# Patient Record
Sex: Female | Born: 1955 | Race: White | Hispanic: No | Marital: Married | Smoking: Current every day smoker
Health system: Southern US, Community
[De-identification: ages and names within clinical notes are randomized; demographics above are authoritative.]

## PROBLEM LIST (undated history)

## (undated) DIAGNOSIS — T7840XA Allergy, unspecified, initial encounter: Secondary | ICD-10-CM

## (undated) DIAGNOSIS — K219 Gastro-esophageal reflux disease without esophagitis: Secondary | ICD-10-CM

## (undated) DIAGNOSIS — I10 Essential (primary) hypertension: Secondary | ICD-10-CM

## (undated) DIAGNOSIS — F419 Anxiety disorder, unspecified: Secondary | ICD-10-CM

## (undated) HISTORY — PX: ABDOMINAL HYSTERECTOMY: SHX81

## (undated) HISTORY — DX: Gastro-esophageal reflux disease without esophagitis: K21.9

## (undated) HISTORY — DX: Allergy, unspecified, initial encounter: T78.40XA

## (undated) HISTORY — DX: Anxiety disorder, unspecified: F41.9

## (undated) HISTORY — PX: TONSILLECTOMY: SUR1361

---

## 2011-12-26 ENCOUNTER — Emergency Department (HOSPITAL_COMMUNITY)
Admission: EM | Admit: 2011-12-26 | Discharge: 2011-12-26 | Disposition: A | Discharge status: Home / Self Care | Payer: Self-pay | Attending: Emergency Medicine | Admitting: Emergency Medicine

## 2011-12-26 ENCOUNTER — Emergency Department (HOSPITAL_COMMUNITY): Payer: Self-pay

## 2011-12-26 ENCOUNTER — Encounter (HOSPITAL_COMMUNITY): Payer: Self-pay | Admitting: Physical Medicine and Rehabilitation

## 2011-12-26 DIAGNOSIS — I10 Essential (primary) hypertension: Secondary | ICD-10-CM | POA: Insufficient documentation

## 2011-12-26 DIAGNOSIS — F17200 Nicotine dependence, unspecified, uncomplicated: Secondary | ICD-10-CM | POA: Insufficient documentation

## 2011-12-26 DIAGNOSIS — J189 Pneumonia, unspecified organism: Secondary | ICD-10-CM | POA: Insufficient documentation

## 2011-12-26 DIAGNOSIS — Z720 Tobacco use: Secondary | ICD-10-CM

## 2011-12-26 HISTORY — DX: Essential (primary) hypertension: I10

## 2011-12-26 LAB — CBC
HCT: 42.1 % (ref 36.0–46.0)
Hemoglobin: 14.3 g/dL (ref 12.0–15.0)
WBC: 4.3 10*3/uL (ref 4.0–10.5)

## 2011-12-26 MED ORDER — AMLODIPINE BESYLATE 10 MG PO TABS: 10.0000 mg | ORAL_TABLET | Freq: Every day | ORAL | Status: DC

## 2011-12-26 MED ORDER — AZITHROMYCIN 250 MG PO TABS: 250.0000 mg | ORAL_TABLET | Freq: Every day | ORAL | Status: AC

## 2011-12-26 MED ORDER — ALBUTEROL SULFATE HFA 108 (90 BASE) MCG/ACT IN AERS
2.0000 | INHALATION_SPRAY | RESPIRATORY_TRACT | Status: DC | PRN
  Administered 2011-12-26: 2 via RESPIRATORY_TRACT
  Filled 2011-12-26: qty 6.7

## 2011-12-26 MED ORDER — AZITHROMYCIN 250 MG PO TABS
500.0000 mg | ORAL_TABLET | Freq: Once | ORAL | Status: AC
  Administered 2011-12-26: 500 mg via ORAL
  Filled 2011-12-26: qty 2

## 2011-12-26 NOTE — ED Notes (Signed)
Patient C/O malaise and cough since Tuesday.  C/O having a fever yesterday. C/O nausea, Vomiting and Diarrhea yesterday.

## 2011-12-26 NOTE — ED Provider Notes (Signed)
History   This chart was scribed for Tracey Justen B. Bernette Mayers, MD by [name]. The patient was seen in room TR08C/TR08C. Patient's care was started at 1223.  CSN: [id number]  Arrival date & time 12/26/11  1223   First MD Initiated Contact with Patient 12/26/11 1421      Chief Complaint  Patient presents with  . Shortness of Breath  . Cough  . Fever   Patient is a 56 y.o. female presenting with shortness of breath, cough, and fever. The history is provided by the patient. No language interpreter was used.  Shortness of Breath  The current episode started 3 to 5 days ago. The problem occurs occasionally. The problem has been gradually worsening. Nothing relieves the symptoms. The symptoms are aggravated by smoke exposure. Associated symptoms include a fever, cough and shortness of breath. Pertinent negatives include no chest pain and no rhinorrhea.  Cough Associated symptoms include shortness of breath. Pertinent negatives include no chest pain, no headaches and no rhinorrhea.  Fever Primary symptoms of the febrile illness include fever, cough and shortness of breath. Primary symptoms do not include headaches, abdominal pain, nausea, vomiting, diarrhea, dysuria or rash.    Tracey Nixon is a 56 y.o. female who presents to the Emergency Department complaining of sudden onset moderate severe constant cough with associated SOB and fever onset 3 days ago. Pt reports yellow colored phlegm, but denies pain. She has treated symptoms with Musonex with no relief. Pt is a current everyday smoker who denies the use of alcohol. Lists a h/o hypertension.  Past Medical History  Diagnosis Date  . Hypertension     No past surgical history on file.  No family history on file.  History  Substance Use Topics  . Smoking status: Current Everyday Smoker    Types: Cigarettes  . Smokeless tobacco: Not on file  . Alcohol Use: No   Review of Systems  Constitutional: Positive for fever.  HENT: Negative for  rhinorrhea.   Eyes: Negative for pain.  Respiratory: Positive for cough and shortness of breath.   Cardiovascular: Negative for chest pain.  Gastrointestinal: Negative for nausea, vomiting, abdominal pain and diarrhea.  Genitourinary: Negative for dysuria.  Musculoskeletal: Negative for back pain.  Skin: Negative for rash.  Neurological: Negative for weakness and headaches.  All other systems reviewed and are negative.    Allergies  Darvocet and Demerol  Home Medications   Current Outpatient Rx  Name Route Sig Dispense Refill  . ACETAMINOPHEN 500 MG PO TABS Oral Take 500 mg by mouth every 6 (six) hours as needed. For pain/fever.    . GUAIFENESIN ER 600 MG PO TB12 Oral Take 1,200 mg by mouth 2 (two) times daily as needed. For cold symptoms.      BP 168/90  Pulse 92  Temp 97.5 F (36.4 C) (Oral)  Resp 22  SpO2 99%  Physical Exam  Nursing note and vitals reviewed. Constitutional: She is oriented to person, place, and time. She appears well-developed and well-nourished. No distress.  HENT:  Head: Normocephalic and atraumatic.  Eyes: EOM are normal. Pupils are equal, round, and reactive to light.  Neck: Neck supple. No tracheal deviation present.  Cardiovascular: Normal rate.   Pulmonary/Chest: Effort normal. No respiratory distress. She has wheezes.       Crackles throughout.  Abdominal: Soft. She exhibits no distension.  Musculoskeletal: Normal range of motion. She exhibits no edema.  Neurological: She is alert and oriented to person, place, and time. No sensory  deficit.  Skin: Skin is warm and dry.  Psychiatric: She has a normal mood and affect. Her behavior is normal.    ED Course  Procedures (including critical care time) DIAGNOSTIC STUDIES: Oxygen Saturation is 99% on room air, normal by my interpretation.    COORDINATION OF CARE: 1434- Evaluated Pt.     Labs Reviewed  CBC   Dg Chest 2 View  12/26/2011  *RADIOLOGY REPORT*  Clinical Data: Short of breath.   Cough.  Fever.  CHEST - 2 VIEW  Comparison: None.  Findings: Heart size is normal.  Mediastinal shadows are normal. There is central bronchial thickening.  There may be minimal patchy density at the right lung base.  No consolidation.  No effusion. Ordinary degenerative changes effect the spine.  IMPRESSION: Suspicion of bronchitis.  Patchy density at the right base that could be scarring, atelectasis or mild right base pneumonia.  Original Report Authenticated By: Thomasenia Sales, M.D.     No diagnosis found.    MDM  Pt is a heavy smoker with bronchitis and ?early pneumonia. Given Abx, inhaler and refill on BP meds.       I personally performed the services described in the documentation, which were scribed in my presence. The recorded information has been reviewed and considered.     [name] B. Bernette Mayers, MD 12/26/11 [phone]

## 2011-12-26 NOTE — ED Notes (Signed)
Pt presents to department for evaluation of cough, shortness of breath, fever and generalized weakness. Ongoing x3 days. Pt states yellow colored sputum. Respirations unlabored. Denies pain. She is conscious alert and oriented x4. Skin warm and dry.

## 2011-12-26 NOTE — ED Notes (Signed)
Pt lying on lobby seats, resting with eyes closed.

## 2011-12-26 NOTE — ED Notes (Signed)
Pt ambulatory to and from radiology with tech and tolerated well. 

## 2012-01-24 ENCOUNTER — Encounter (HOSPITAL_COMMUNITY): Payer: Self-pay | Admitting: Emergency Medicine

## 2012-01-24 ENCOUNTER — Emergency Department (HOSPITAL_COMMUNITY)
Admission: EM | Admit: 2012-01-24 | Discharge: 2012-01-24 | Disposition: A | Discharge status: Home / Self Care | Payer: Self-pay | Attending: Emergency Medicine | Admitting: Emergency Medicine

## 2012-01-24 DIAGNOSIS — S81859A Open bite, unspecified lower leg, initial encounter: Secondary | ICD-10-CM

## 2012-01-24 DIAGNOSIS — S91009A Unspecified open wound, unspecified ankle, initial encounter: Secondary | ICD-10-CM | POA: Insufficient documentation

## 2012-01-24 DIAGNOSIS — W540XXA Bitten by dog, initial encounter: Secondary | ICD-10-CM | POA: Insufficient documentation

## 2012-01-24 DIAGNOSIS — S81009A Unspecified open wound, unspecified knee, initial encounter: Secondary | ICD-10-CM | POA: Insufficient documentation

## 2012-01-24 MED ORDER — PENICILLIN V POTASSIUM 500 MG PO TABS
ORAL_TABLET | ORAL | Status: AC
  Administered 2012-01-24: 500 mg via ORAL
  Filled 2012-01-24: qty 1

## 2012-01-24 MED ORDER — TRIAMCINOLONE ACETONIDE 40 MG/ML IJ SUSP
40.0000 mg | Freq: Once | INTRAMUSCULAR | Status: DC
  Filled 2012-01-24: qty 1

## 2012-01-24 MED ORDER — PENICILLIN V POTASSIUM 500 MG PO TABS: 500.0000 mg | ORAL_TABLET | Freq: Three times a day (TID) | ORAL | Status: AC

## 2012-01-24 MED ORDER — PENICILLIN V POTASSIUM 500 MG PO TABS
500.0000 mg | ORAL_TABLET | Freq: Once | ORAL | Status: AC
  Administered 2012-01-24: 500 mg via ORAL

## 2012-01-24 NOTE — ED Provider Notes (Signed)
History     CSN: [id number]  Arrival date & time 01/24/12  1903   First MD Initiated Contact with Patient 01/24/12 2008      No chief complaint on file.   (Consider location/radiation/quality/duration/timing/severity/associated sxs/prior treatment) HPI  -year-old female in no acute distress complaining of dog bite to posterior left knee occurring several hours ago. Patient was bit by a friend's pit bull when she entered the backyard. Patient's tetanus was given within the last 5 years. Bleeding control and patient states she has full range of motion to the knee without numbness or paresthesia.  Past Medical History  Diagnosis Date  . Hypertension     Past Surgical History  Procedure Date  . Abdominal hysterectomy     History reviewed. No pertinent family history.  History  Substance Use Topics  . Smoking status: Current Everyday Smoker -- 0.5 packs/day    Types: Cigarettes  . Smokeless tobacco: Not on file  . Alcohol Use: No    OB History    Grav Para Term Preterm Abortions TAB SAB Ect Mult Living                  Review of Systems  Skin: Positive for wound.  All other systems reviewed and are negative.    Allergies  Darvocet and Demerol  Home Medications   Current Outpatient Rx  Name Route Sig Dispense Refill  . AMLODIPINE BESYLATE 10 MG PO TABS Oral Take 10 mg by mouth daily.    Marland Kitchen PENICILLIN V POTASSIUM 500 MG PO TABS Oral Take 1 tablet (500 mg total) by mouth 3 (three) times daily. 30 tablet 0    BP 184/86  Pulse 96  Temp 100.3 F (37.9 C) (Oral)  Resp 22  Ht 5\' 2"  (1.575 m)  Wt 140 lb (63.504 kg)  BMI 25.61 kg/m2  SpO2 99%  Physical Exam  Vitals reviewed. Constitutional: She is oriented to person, place, and time. She appears well-developed and well-nourished. No distress.  HENT:  Head: Normocephalic.  Eyes: Conjunctivae and EOM are normal.  Cardiovascular: Normal rate.   Pulmonary/Chest: Effort normal.  Musculoskeletal: Normal range of  motion.  Neurological: She is alert and oriented to person, place, and time.  Skin:       Contusion in the shape of teeth marks to the posterior of the left knee with swelling. Patient also has 2x 1 cm full-thickness lacerations no penetration into the muscle. Wound is not contaminated.  Psychiatric: She has a normal mood and affect.    ED Course  Procedures (including critical care time)  LACERATION REPAIR Performed by: Wynetta Emery Authorized by: Wynetta Emery Consent: Verbal consent obtained. Risks and benefits: risks, benefits and alternatives were discussed Consent given by: patient Patient identity confirmed: provided demographic data Prepped and Draped in normal sterile fashion Wound explored  Laceration Location: Posterior left knee  Laceration Length: 2x  1 cm  No Foreign Bodies seen or palpated  Anesthesia: local infiltration  Local anesthetic: lidocaine 2% without epinephrine  Anesthetic total: 5 ml  Irrigation method: syringe Amount of cleaning: Wound irrigated with 1 L of normal saline under low pressure   Skin closure: Wound was approximated loosely and secured with half-inch Steri-Strips    Patient tolerance: Patient tolerated the procedure well with no immediate complications.  Labs Reviewed - No data to display No results found.   1. Dog Bite Of Lower Leg       MDM  Patient presenting with dog bite to  posterior left knee. Dog bite report form for [place] for [place] filled out by [employer]. Wound irrigated profusely with 1 L of normal saline patient has been to ask 1 cm full-thickness lacerations. Is a bite wound is not appropriate to close. I approximated the wound loosely and closed with Steri-Strips and povidone iodine. Patient is very concerned about the money for antibiotics therefore instead of Augmentin I will give her penicillin VK. First dose will be given in the ED. Patient felt extensively on wound care and signs of infection and advised to  return immediately for any sign of infection. Pt verbalized understanding and agrees with care plan. Outpatient follow-up and return precautions given.          Wynetta Emery, PA-C 01/24/12 2125

## 2012-01-24 NOTE — ED Provider Notes (Signed)
Medical screening examination/treatment/procedure(s) were performed by non-physician practitioner and as supervising physician I was immediately available for consultation/collaboration.   Gaetan Spieker M [place], MD 01/24/12 2248 

## 2012-01-24 NOTE — ED Notes (Signed)
Bit by pitbull on back of left knee. 2 puncture marks and abrasions to back of knee, bleeding controlled

## 2012-01-24 NOTE — ED Notes (Signed)
Animal bite faxed [phone]

## 2012-03-27 ENCOUNTER — Emergency Department (HOSPITAL_COMMUNITY)
Admission: EM | Admit: 2012-03-27 | Discharge: 2012-03-28 | Discharge status: Against Medical Advice | Payer: Self-pay | Attending: Emergency Medicine | Admitting: Emergency Medicine

## 2012-03-27 ENCOUNTER — Encounter (HOSPITAL_COMMUNITY): Payer: Self-pay | Admitting: Family Medicine

## 2012-03-27 ENCOUNTER — Emergency Department (HOSPITAL_COMMUNITY): Payer: Self-pay

## 2012-03-27 DIAGNOSIS — R059 Cough, unspecified: Secondary | ICD-10-CM | POA: Insufficient documentation

## 2012-03-27 DIAGNOSIS — R509 Fever, unspecified: Secondary | ICD-10-CM | POA: Insufficient documentation

## 2012-03-27 DIAGNOSIS — R05 Cough: Secondary | ICD-10-CM | POA: Insufficient documentation

## 2012-03-27 DIAGNOSIS — J3489 Other specified disorders of nose and nasal sinuses: Secondary | ICD-10-CM | POA: Insufficient documentation

## 2012-03-27 NOTE — ED Notes (Signed)
Patient states "I think I got the flu." States she has had fever, chills, cough and congestion since last night. States she has been in bed all day with body aches.

## 2012-03-27 NOTE — ED Notes (Signed)
Pt not WR when called.

## 2012-03-27 NOTE — ED Notes (Signed)
Pt not in WR when called

## 2012-03-28 NOTE — ED Notes (Signed)
Pt not in WR when called

## 2012-09-21 ENCOUNTER — Emergency Department (HOSPITAL_COMMUNITY)
Admission: EM | Admit: 2012-09-21 | Discharge: 2012-09-22 | Disposition: A | Discharge status: Home / Self Care | Payer: No Typology Code available for payment source | Attending: Emergency Medicine | Admitting: Emergency Medicine

## 2012-09-21 ENCOUNTER — Encounter (HOSPITAL_COMMUNITY): Payer: Self-pay

## 2012-09-21 ENCOUNTER — Emergency Department (HOSPITAL_COMMUNITY): Payer: No Typology Code available for payment source

## 2012-09-21 DIAGNOSIS — Y9389 Activity, other specified: Secondary | ICD-10-CM | POA: Insufficient documentation

## 2012-09-21 DIAGNOSIS — S46909A Unspecified injury of unspecified muscle, fascia and tendon at shoulder and upper arm level, unspecified arm, initial encounter: Secondary | ICD-10-CM | POA: Insufficient documentation

## 2012-09-21 DIAGNOSIS — M25511 Pain in right shoulder: Secondary | ICD-10-CM

## 2012-09-21 DIAGNOSIS — S4980XA Other specified injuries of shoulder and upper arm, unspecified arm, initial encounter: Secondary | ICD-10-CM | POA: Insufficient documentation

## 2012-09-21 DIAGNOSIS — Y92410 Unspecified street and highway as the place of occurrence of the external cause: Secondary | ICD-10-CM | POA: Insufficient documentation
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
## 2012-09-22 MED ORDER — AMLODIPINE BESYLATE 10 MG PO TABS: 10.0000 mg | ORAL_TABLET | Freq: Every day | ORAL | Status: DC

## 2012-09-22 MED ORDER — METHOCARBAMOL 500 MG PO TABS: 500.0000 mg | ORAL_TABLET | Freq: Two times a day (BID) | ORAL | Status: DC

## 2012-09-22 MED ORDER — HYDROCODONE-ACETAMINOPHEN 5-325 MG PO TABS: 1.0000 | ORAL_TABLET | ORAL | Status: DC | PRN

## 2012-09-23 NOTE — ED Provider Notes (Signed)
Medical screening examination/treatment/procedure(s) were performed by non-physician practitioner and as supervising physician I was immediately available for consultation/collaboration.  Mikalia Fessel T Mckinsey Keagle, MD 09/23/12 1144 

## 2016-01-18 ENCOUNTER — Encounter (HOSPITAL_COMMUNITY): Payer: Self-pay | Admitting: Emergency Medicine

## 2016-01-18 ENCOUNTER — Observation Stay (HOSPITAL_COMMUNITY): Payer: Self-pay

## 2016-01-18 ENCOUNTER — Observation Stay (HOSPITAL_COMMUNITY)
Admission: EM | Admit: 2016-01-18 | Discharge: 2016-01-19 | Disposition: A | Discharge status: Home / Self Care | Payer: Self-pay | Attending: Internal Medicine | Admitting: Internal Medicine

## 2016-01-18 ENCOUNTER — Emergency Department (HOSPITAL_COMMUNITY): Payer: Self-pay

## 2016-01-18 DIAGNOSIS — M79602 Pain in left arm: Secondary | ICD-10-CM | POA: Diagnosis present

## 2016-01-18 DIAGNOSIS — Z720 Tobacco use: Secondary | ICD-10-CM | POA: Diagnosis present

## 2016-01-18 DIAGNOSIS — F17210 Nicotine dependence, cigarettes, uncomplicated: Secondary | ICD-10-CM | POA: Insufficient documentation

## 2016-01-18 DIAGNOSIS — Z79899 Other long term (current) drug therapy: Secondary | ICD-10-CM | POA: Insufficient documentation

## 2016-01-18 DIAGNOSIS — N289 Disorder of kidney and ureter, unspecified: Secondary | ICD-10-CM

## 2016-01-18 DIAGNOSIS — R4182 Altered mental status, unspecified: Principal | ICD-10-CM

## 2016-01-18 DIAGNOSIS — I10 Essential (primary) hypertension: Secondary | ICD-10-CM | POA: Diagnosis present

## 2016-01-18 DIAGNOSIS — Z9119 Patient's noncompliance with other medical treatment and regimen: Secondary | ICD-10-CM | POA: Insufficient documentation

## 2016-01-18 DIAGNOSIS — F1411 Cocaine abuse, in remission: Secondary | ICD-10-CM | POA: Diagnosis present

## 2016-01-18 DIAGNOSIS — F1410 Cocaine abuse, uncomplicated: Secondary | ICD-10-CM

## 2016-01-18 DIAGNOSIS — I129 Hypertensive chronic kidney disease with stage 1 through stage 4 chronic kidney disease, or unspecified chronic kidney disease: Secondary | ICD-10-CM | POA: Insufficient documentation

## 2016-01-18 DIAGNOSIS — N183 Chronic kidney disease, stage 3 unspecified: Secondary | ICD-10-CM | POA: Diagnosis present

## 2016-01-18 DIAGNOSIS — R410 Disorientation, unspecified: Secondary | ICD-10-CM

## 2016-01-18 DIAGNOSIS — G459 Transient cerebral ischemic attack, unspecified: Secondary | ICD-10-CM

## 2016-01-18 LAB — RAPID URINE DRUG SCREEN, HOSP PERFORMED
Amphetamines: NOT DETECTED
BENZODIAZEPINES: NOT DETECTED
Barbiturates: NOT DETECTED
COCAINE: POSITIVE — AB
OPIATES: NOT DETECTED
Tetrahydrocannabinol: NOT DETECTED

## 2016-01-18 LAB — I-STAT CHEM 8, ED
BUN: 23 mg/dL — ABNORMAL HIGH (ref 6–20)
Calcium, Ion: 1.1 mmol/L — ABNORMAL LOW (ref 1.12–1.23)
Chloride: 104 mmol/L (ref 101–111)
Creatinine, Ser: 1.4 mg/dL — ABNORMAL HIGH (ref 0.44–1.00)
Glucose, Bld: 128 mg/dL — ABNORMAL HIGH (ref 65–99)
HEMATOCRIT: 42 % (ref 36.0–46.0)
HEMOGLOBIN: 14.3 g/dL (ref 12.0–15.0)
POTASSIUM: 3.6 mmol/L (ref 3.5–5.1)
SODIUM: 139 mmol/L (ref 135–145)
TCO2: 23 mmol/L (ref 0–100)

## 2016-01-18 LAB — COMPREHENSIVE METABOLIC PANEL
ALT: 14 U/L (ref 14–54)
ANION GAP: 12 (ref 5–15)
AST: 21 U/L (ref 15–41)
Albumin: 3.9 g/dL (ref 3.5–5.0)
Alkaline Phosphatase: 87 U/L (ref 38–126)
BUN: 20 mg/dL (ref 6–20)
CHLORIDE: 103 mmol/L (ref 101–111)
CO2: 21 mmol/L — ABNORMAL LOW (ref 22–32)
Calcium: 8.9 mg/dL (ref 8.9–10.3)
Creatinine, Ser: 1.41 mg/dL — ABNORMAL HIGH (ref 0.44–1.00)
GFR, EST AFRICAN AMERICAN: 45 mL/min — AB (ref >60)
GFR, EST NON AFRICAN AMERICAN: 39 mL/min — AB (ref >60)
Glucose, Bld: 130 mg/dL — ABNORMAL HIGH (ref 65–99)
POTASSIUM: 3.7 mmol/L (ref 3.5–5.1)
Sodium: 136 mmol/L (ref 135–145)
Total Bilirubin: 0.9 mg/dL (ref 0.3–1.2)
Total Protein: 7.1 g/dL (ref 6.5–8.1)

## 2016-01-18 LAB — DIFFERENTIAL
BASOS ABS: 0 10*3/uL (ref 0.0–0.1)
BASOS PCT: 0 %
EOS ABS: 0.2 10*3/uL (ref 0.0–0.7)
EOS PCT: 2 %
Lymphocytes Relative: 21 %
Lymphs Abs: 2.4 10*3/uL (ref 0.7–4.0)
MONOS PCT: 6 %
Monocytes Absolute: 0.6 10*3/uL (ref 0.1–1.0)
NEUTROS PCT: 71 %
Neutro Abs: 7.8 10*3/uL — ABNORMAL HIGH (ref 1.7–7.7)

## 2016-01-18 LAB — CBC
HEMATOCRIT: 39.5 % (ref 36.0–46.0)
Hemoglobin: 13.1 g/dL (ref 12.0–15.0)
MCH: 29 pg (ref 26.0–34.0)
MCHC: 33.2 g/dL (ref 30.0–36.0)
MCV: 87.4 fL (ref 78.0–100.0)
Platelets: 371 10*3/uL (ref 150–400)
RBC: 4.52 MIL/uL (ref 3.87–5.11)
RDW: 16.3 % — AB (ref 11.5–15.5)
WBC: 11 10*3/uL — ABNORMAL HIGH (ref 4.0–10.5)

## 2016-01-18 LAB — URINE MICROSCOPIC-ADD ON

## 2016-01-18 LAB — URINALYSIS, ROUTINE W REFLEX MICROSCOPIC
Bilirubin Urine: NEGATIVE
GLUCOSE, UA: NEGATIVE mg/dL
Ketones, ur: NEGATIVE mg/dL
LEUKOCYTES UA: NEGATIVE
Nitrite: NEGATIVE
PH: 5.5 (ref 5.0–8.0)
Protein, ur: NEGATIVE mg/dL
SPECIFIC GRAVITY, URINE: 1.012 (ref 1.005–1.030)

## 2016-01-18 LAB — PROTIME-INR
INR: 1.17
PROTHROMBIN TIME: 15 s (ref 11.4–15.2)

## 2016-01-18 LAB — I-STAT TROPONIN, ED: TROPONIN I, POC: 0 ng/mL (ref 0.00–0.08)

## 2016-01-18 LAB — APTT: APTT: 42 s — AB (ref 24–36)

## 2016-01-18 LAB — CBG MONITORING, ED: Glucose-Capillary: 124 mg/dL — ABNORMAL HIGH (ref 65–99)

## 2016-01-18 LAB — ETHANOL

## 2016-01-18 MED ORDER — LORAZEPAM 2 MG/ML IJ SOLN: 1.0000 mg | Freq: Two times a day (BID) | INTRAMUSCULAR | Status: DC | PRN

## 2016-01-18 MED ORDER — ACETAMINOPHEN 325 MG PO TABS: 650.0000 mg | ORAL_TABLET | Freq: Four times a day (QID) | ORAL | Status: DC | PRN

## 2016-01-18 MED ORDER — NICOTINE 21 MG/24HR TD PT24
21.0000 mg | MEDICATED_PATCH | Freq: Every day | TRANSDERMAL | Status: DC
  Administered 2016-01-18 – 2016-01-19 (×2): 21 mg via TRANSDERMAL
  Filled 2016-01-18 (×2): qty 1

## 2016-01-18 MED ORDER — ONDANSETRON HCL 4 MG PO TABS: 4.0000 mg | ORAL_TABLET | Freq: Four times a day (QID) | ORAL | Status: DC | PRN

## 2016-01-18 MED ORDER — METHOCARBAMOL 500 MG PO TABS: 500.0000 mg | ORAL_TABLET | Freq: Two times a day (BID) | ORAL | Status: DC | PRN

## 2016-01-18 MED ORDER — STROKE: EARLY STAGES OF RECOVERY BOOK
Freq: Once | Status: DC
  Filled 2016-01-18: qty 1

## 2016-01-18 MED ORDER — AMLODIPINE BESYLATE 10 MG PO TABS
10.0000 mg | ORAL_TABLET | Freq: Every day | ORAL | Status: DC
  Administered 2016-01-18 – 2016-01-19 (×2): 10 mg via ORAL
  Filled 2016-01-18 (×2): qty 1

## 2016-01-18 MED ORDER — ENOXAPARIN SODIUM 40 MG/0.4ML ~~LOC~~ SOLN
40.0000 mg | SUBCUTANEOUS | Status: DC
  Administered 2016-01-18 – 2016-01-19 (×2): 40 mg via SUBCUTANEOUS
  Filled 2016-01-18 (×2): qty 0.4

## 2016-01-18 MED ORDER — ONDANSETRON HCL 4 MG/2ML IJ SOLN: 4.0000 mg | Freq: Four times a day (QID) | INTRAMUSCULAR | Status: DC | PRN

## 2016-01-18 MED ORDER — SODIUM CHLORIDE 0.9% FLUSH
3.0000 mL | Freq: Two times a day (BID) | INTRAVENOUS | Status: DC
  Administered 2016-01-18: 3 mL via INTRAVENOUS

## 2016-01-18 MED ORDER — ACETAMINOPHEN 650 MG RE SUPP: 650.0000 mg | Freq: Four times a day (QID) | RECTAL | Status: DC | PRN

## 2016-01-18 NOTE — ED Provider Notes (Signed)
[facility]-EMERGENCY DEPT Provider Note   CSN: [id number] Arrival date & time: 01/18/16  4098  First Provider Contact:  First MD Initiated Contact with Patient 01/18/16 0606        History   Chief Complaint Unresponsive  HPI Baylei Duffner is a 60 y.o. female.  The history is provided by the EMS personnel and the spouse. The history is limited by the condition of the patient (Unresponsive).  She has a history of hypertension. She had gone to sleep at about 2 AM. About 5 AM, she got up and went to look after cat. She came back in since she didn't feel well. According to her husband, her right arm was shaking. She states she would have been bitten on her upper arm earlier and that it seemed to be getting worse. She then collapsed and is been unresponsive since then. EMS was called and relate that patient has been unresponsive and with rightward gaze preference. She's never had any episodes like this before.  Past Medical History:  Diagnosis Date  . Hypertension     There are no active problems to display for this patient.   Past Surgical History:  Procedure Laterality Date  . ABDOMINAL HYSTERECTOMY      OB History    No data available       Home Medications    Prior to Admission medications   Medication Sig Start Date End Date Taking? Authorizing Provider  amLODipine (NORVASC) 10 MG tablet Take 10 mg by mouth daily. 12/26/11 12/25/12  Susy Frizzle, MD  amLODipine (NORVASC) 10 MG tablet Take 1 tablet (10 mg total) by mouth daily. 09/22/12   Garlon Hatchet, PA-C  HYDROcodone-acetaminophen (NORCO/VICODIN) 5-325 MG per tablet Take 1 tablet by mouth every 4 (four) hours as needed for pain. 09/22/12   Garlon Hatchet, PA-C  ibuprofen (ADVIL,MOTRIN) 200 MG tablet Take 200 mg by mouth every 6 (six) hours as needed for pain.    Historical Provider, MD  methocarbamol (ROBAXIN) 500 MG tablet Take 1 tablet (500 mg total) by mouth 2 (two) times daily. 09/22/12   Garlon Hatchet, PA-C    Family  History No family history on file.  Social History Social History  Substance Use Topics  . Smoking status: Current Every Day Smoker    Packs/day: 0.50    Types: Cigarettes  . Smokeless tobacco: Not on file  . Alcohol use No     Allergies   Darvocet [propoxyphene n-acetaminophen] and Demerol [meperidine]   Review of Systems Review of Systems  Unable to perform ROS: Patient unresponsive     Physical Exam Updated Vital Signs BP 160/94   Pulse 71   Temp (!) 96.7 F (35.9 C) (Axillary)   Resp 25   Ht 5\' 2"  (1.575 m)   Wt 181 lb 7 oz (82.3 kg)   SpO2 96%   BMI 33.19 kg/m   Physical Exam  Nursing note and vitals reviewed.  60 year old female, resting comfortably and in no acute distress. Vital signs are significant for hypertension. Oxygen saturation is 96%, which is normal. Head is normocephalic and atraumatic. PERRLA. Eyes are looking forward but immediately go to the right when lids are forced open. Oropharynx is clear. Neck is nontender and supple without adenopathy or JVD. There are no carotid bruits. Back is nontender and there is no CVA tenderness. Lungs are clear without rales, wheezes, or rhonchi. Chest is nontender. Heart has regular rate and rhythm without murmur. Abdomen is soft, flat,  nontender without masses or hepatosplenomegaly and peristalsis is normoactive. Extremities have no cyanosis or edema, full range of motion is present. Area of mild erythema of the right upper arm consistent with local reaction to an insect bite. Skin is warm and dry without rash. Neurologic: She is unresponsive to verbal and painful stimuli. There is no obvious facial asymmetry. Strong gaze preference to the right. Arms and legs are flaccid. No Babinski reflex. No posturing..   ED Treatments / Results  Labs (all labs ordered are listed, but only abnormal results are displayed) Labs Reviewed  APTT - Abnormal; Notable for the following:       Result Value   aPTT 42 (*)     All other components within normal limits  CBC - Abnormal; Notable for the following:    WBC 11.0 (*)    RDW 16.3 (*)    All other components within normal limits  DIFFERENTIAL - Abnormal; Notable for the following:    Neutro Abs 7.8 (*)    All other components within normal limits  COMPREHENSIVE METABOLIC PANEL - Abnormal; Notable for the following:    CO2 21 (*)    Glucose, Bld 130 (*)    Creatinine, Ser 1.41 (*)    GFR calc non Af Amer 39 (*)    GFR calc Af Amer 45 (*)    All other components within normal limits  URINE RAPID DRUG SCREEN, HOSP PERFORMED - Abnormal; Notable for the following:    Cocaine POSITIVE (*)    All other components within normal limits  URINALYSIS, ROUTINE W REFLEX MICROSCOPIC (NOT AT [facility]) - Abnormal; Notable for the following:    Hgb urine dipstick TRACE (*)    All other components within normal limits  URINE MICROSCOPIC-ADD ON - Abnormal; Notable for the following:    Squamous Epithelial / LPF 0-5 (*)    Bacteria, UA RARE (*)    Casts HYALINE CASTS (*)    All other components within normal limits  I-STAT CHEM 8, ED - Abnormal; Notable for the following:    BUN 23 (*)    Creatinine, Ser 1.40 (*)    Glucose, Bld 128 (*)    Calcium, Ion 1.10 (*)    All other components within normal limits  CBG MONITORING, ED - Abnormal; Notable for the following:    Glucose-Capillary 124 (*)    All other components within normal limits  ETHANOL  PROTIME-INR  I-STAT TROPOININ, ED    EKG  EKG Interpretation  Date/Time:  Friday January 18 2016 06:26:44 EDT Ventricular Rate:  90 PR Interval:    QRS Duration: 98 QT Interval:  399 QTC Calculation: 489 R Axis:   67 Text Interpretation:  Sinus rhythm Probable left atrial enlargement Borderline prolonged QT interval No old tracing to compare Confirmed by [facility]  MD, Sheryle Vice ([id number]) on 01/18/2016 7:15:04 AM       Radiology Ct Head Wo Contrast  Result Date: 01/18/2016 CLINICAL DATA:  Initial evaluation for acute  unresponsiveness. EXAM: CT HEAD WITHOUT CONTRAST TECHNIQUE: Contiguous axial images were obtained from the base of the skull through the vertex without intravenous contrast. COMPARISON:  None. FINDINGS: Age-related cerebral volume loss present. Patchy and confluent hypodensity within the periventricular and deep white matter both cerebral hemispheres most likely related chronic small vessel ischemic disease. Probable small remote lacunar infarct within the left basal ganglia. No definite acute large vessel territory infarct. Gray-white matter differentiation maintained. Deep gray nuclei relatively maintained. Vasculature is fairly uniform in appearance  without definite hyperdense vessel. No acute intracranial hemorrhage. No mass lesion, midline shift, or mass effect. No hydrocephalus. No extra-axial fluid collection. Scalp soft tissues within normal limits. No acute abnormality about the globes and orbits. Paranasal sinuses are clear.  No mastoid effusion. Calvarium intact. IMPRESSION: 1. No acute intracranial process identified. 2. Age-related cerebral atrophy with moderate chronic microvascular ischemic disease. Critical Value/emergent results were called by telephone at the time of interpretation on 01/18/2016 at 6:44 am to Dr. Roseanne Reno, who verbally acknowledged these results. Electronically Signed   By: Rise Mu M.D.   On: 01/18/2016 06:51   Procedures Procedures (including critical care time) CRITICAL CARE Performed by: RAXEN,MMHWK Total critical care time: 45 minutes Critical care time was exclusive of separately billable procedures and treating other patients. Critical care was necessary to treat or prevent imminent or life-threatening deterioration. Critical care was time spent personally by me on the following activities: development of treatment plan with patient and/or surrogate as well as nursing, discussions with consultants, evaluation of patient's response to treatment,  examination of patient, obtaining history from patient or surrogate, ordering and performing treatments and interventions, ordering and review of laboratory studies, ordering and review of radiographic studies, pulse oximetry and re-evaluation of patient's condition.   Medications Ordered in ED Medications - No data to display   Initial Impression / Assessment and Plan / ED Course  I have reviewed the triage vital signs and the nursing notes.  Pertinent labs & imaging results that were available during my care of the patient were reviewed by me and considered in my medical decision making (see chart for details).  Clinical Course  Comment By Time  Renal insufficiency. Cocaine abuse. Dione Booze, MD 07/28 [phone]    Syncopal episode with prolonged loss of consciousness and rightward preference for gaze. Consider atypical seizure. She is being sent for emergent CT scan and routine stroke panel labs are obtained. Old records are reviewed and she has no relevant past visits.  ED workup is significant only for presence of cocaine in urine and a mild renal insufficiency. Neurology has evaluated the patient and feels that this most likely represents a conversion reaction. Patient was reevaluated and is showing some purposeful movements and will avoid letting it dropped hand hit her face. She will be admitted to the medicine service.Case is discussed with Tawanna Cooler Dr. Lawerance Bach of internal medicine teaching service who agrees to admit the patient.  Final Clinical Impressions(s) / ED Diagnoses   Final diagnoses:  Altered mental status, unspecified altered mental status type  Cocaine abuse  Renal insufficiency    New Prescriptions New Prescriptions   No medications on file     Dione Booze, MD 01/18/16 [phone]

## 2016-01-18 NOTE — Progress Notes (Signed)
Code Stroke called on 60 y.o female presenting with altered mental status of unclear etiology.Pertinent history of hypertension and noncompliance with treatment. LSN at 0530 when she went to bed per family. This morning at 0530 to check on cat. According to her husband she returned saying she didn't feel well and her right arm was shaking. She complained of right arm weakness and tingling. EMS was called and she subsequently became unresponsive. Pt brought to [facility].CT scan of her head showed no acute intracranial abnormality, per Neurologist Dr. Roseanne Reno. NIHSS completed. Patient had resistance to eye opening as well as responses to visual threat bilaterally. Muscle tone was equal and normal throughout. NIHSS scored 12 for ALOC, diminished response to sensory in lower extremities, and mute. Code stroke canceled per Dr. Roseanne Reno at [phone]. Right arm swollen and red, ED RN to follow up with EDP.

## 2016-01-18 NOTE — H&P (Signed)
Date: 01/18/2016               Tracey Nixon Name:  Tracey Nixon MRN: [id number]  DOB: [birth year] Age / Sex: 60 y.o., female   PCP: Provider Default, MD         Medical Service: Internal Medicine Teaching Service         Attending Physician: Dr. Doneen Poisson, MD    First Contact: Dr. Althia Forts Pager: [phone]  Second Contact: Dr. Griffin Basil Pager: [phone]       After Hours (After 5p/  First Contact Pager: [phone]  weekends / holidays): Second Contact Pager: [phone]   Chief Complaint: syncope  History of Present Illness: Tracey Nixon is a 60 yo female with PMHx of HTN, Tobacco abuse, Cocaine abuse, reported Bipolar disorder with history of suicidal attempt who presents to the ED after being unresponsive.   History is obtained from Tracey Nixon's Tracey Nixon of 28 years. Tracey Nixon states that yesterday afternoon Tracey Nixon was outside gardening in Tracey Nixon rose bushes Tracey came inside complaining of right arm pain. The area on the proximal anterior arm was red, swollen Tracey painful. Tracey Nixon denied any injury to the area, bites, scratches. He offered to take Tracey Nixon to the hospital for evaluation, but Tracey Nixon reportedly refused staying Tracey Nixon would see how it did overnight. This morning at 0530 Tracey Nixon awoke stating Tracey Nixon right arm was in severe pain. Tracey Nixon Tracey Nixon planned to take Tracey Nixon over to the local fire station for it to be looked at. Tracey Nixon came into the living room telling him to call 911 because Tracey Nixon didn't feel right. Tracey Nixon sat down on the couch Tracey subsequently "passed out." Tracey Nixon right hand was shaking prior to the episode. Tracey Nixon was unresponsive after passing out from that time at 0530 until 0830 in the ED.   When Tracey Nixon originally presented to the ED, Tracey Nixon was called a code stroke. Tracey Nixon was evaluated by the EDP [facility] who cancelled the code stroke Tracey felt Tracey Nixon symptoms were less consistent with a neurologic process Tracey more consistent with conversion disorder. Tracey Nixon was unresponsive to voice during those  examinations; however, notes indicate that Tracey Nixon would withdraw to pain, move Tracey Nixon arm to avoid it hitting Tracey Nixon in Tracey Nixon face Tracey would withdraw to the smell of ammonia. CT head negative for acute CVA. Neurology recommended MRI, EEG, Tracey UDS. UDS positive for cocaine. Around 830 as I was walking into the room, Tracey Nixon's Tracey Nixon told Tracey Nixon Tracey Nixon needed to respond or Tracey Nixon would have to stay in the hospital. Tracey Nixon became alert, writhing in bed with purposeful movements. Tracey Nixon was alert oriented, however difficult to focus Tracey difficult to provide a good history. Tracey Nixon states Tracey Nixon has no memory of this mornings events. Tracey Nixon repeatedly states Tracey Nixon right arm hurts up into Tracey Nixon right side of Tracey Nixon neck. Tracey Nixon admits to nausea but denies associated fever or chills, chest pain, shortness of breath, vomiting or diarrhea. Tracey Nixon denies any known trauma or bites to right arm. Tracey Nixon denies history of blood clots.   In the ED, Tracey Nixon was afebrile, hypertensive at 167/112 to 180/84, HR 86, RR 21, pulse ox 99% on room air. CBC showed mild leukocytosis of 11, Hgb 13. BMET showed creatinine of 1.41. UDS positive for cocaine. Troponin negative. CT head showed no acute intracranial abnormality but cerebral atrophy with chronic ischemia.   Meds: Current Facility-Administered Medications  Medication Dose Route Frequency Provider Last Rate Last Dose  .  stroke: mapping our early stages of recovery book  Does not apply Once Annaleigh Steinmeyer Lucrezia Starch, MD      . acetaminophen (TYLENOL) tablet 650 mg  650 mg Oral Q6H PRN Florine Sprenkle Lucrezia Starch, MD       Or  . acetaminophen (TYLENOL) suppository 650 mg  650 mg Rectal Q6H PRN Royce Stegman Lucrezia Starch, MD      . amLODipine (NORVASC) tablet 10 mg  10 mg Oral Daily Callan Norden R Anton Cheramie, MD      . enoxaparin (LOVENOX) injection 40 mg  40 mg Subcutaneous Q24H Lilianna Case R Tanee Henery, MD      . LORazepam (ATIVAN) injection 1 mg  1 mg Intravenous BID PRN Kathyrn Warmuth Lucrezia Starch, MD      . methocarbamol (ROBAXIN) tablet 500 mg  500 mg Oral BID PRN Karleigh Bunte Lucrezia Starch, MD       . nicotine (NICODERM CQ - dosed in mg/24 hours) patch 21 mg  21 mg Transdermal Daily Johnothan Bascomb R Nattaly Yebra, MD      . sodium chloride flush (NS) 0.9 % injection 3 mL  3 mL Intravenous Q12H Sharnee Douglass Lucrezia Starch, MD        Allergies: Allergies as of 01/18/2016 - Review Complete 01/18/2016  Allergen Reaction Noted  . Darvocet [propoxyphene n-acetaminophen] Other (See Comments) 12/26/2011  . Demerol [meperidine]  12/26/2011   Past Medical History:  Diagnosis Date  . Hypertension     Family History:  Mother: CKD, CAD, HTN  Social History:  Tobacco Use: 2 ppd x 50 years Alcohol Use: Denies Illicit Drug Use: Admit to cocaine use  Review of Systems: A complete ROS was negative except as per HPI.   Physical Exam: Vitals:   01/18/16 0815 01/18/16 0830 01/18/16 0845 01/18/16 0941  BP: 151/75 103/90 126/61 124/60  Pulse: 70 65 70 63  Resp: Temp:    97.7 F (36.5 C)  TempSrc:    Oral  SpO2: 100% 100% 98% 98%  Weight:      Height:       General: Vital signs reviewed.  Tracey Nixon is chronically ill appearing, dishelved, writhing around Tracey cooperative with exam.  Head: Normocephalic Tracey atraumatic. Eyes: EOMI, PERRLA, no scleral icterus.  Neck: Supple, trachea midline, normal ROM, no carotid bruit present.  Cardiovascular: RRR, S1 normal, S2 normal, no murmurs, gallops, or rubs. Pulmonary/Chest: Clear to auscultation bilaterally, no wheezes, rales, or rhonchi. Abdominal: Soft, non-tender, mildly distended, BS +, no guarding present.  Musculoskeletal: Right proximal anterior upper extremity erythema above axilla spread distally to bicep Tracey proximally to axilla, tender, edematous. Two 2 x 1 cm ecchymoses on anterior bicep of unclear etiology. No breaks in skin. Extremities: No lower extremity edema bilaterally,  pulses symmetric Tracey intact bilaterally.  Neurological: A&O x2, Strength is normal Tracey symmetric bilaterally, cranial nerve II-XII are grossly intact, no focal motor deficit,  sensory intact to light touch bilaterally.  Psychiatric: Abnormal mood Tracey affect. speech Tracey behavior is abnormal. Cognition Tracey memory are abnormal.   EKG: Sinus rhythm, QTC 489.  CT Head:  1. No acute intracranial process identified. 2. Age-related cerebral atrophy with moderate chronic microvascular ischemic disease.  Assessment & Plan by Problem: Principal Problem:   Altered mental status Active Problems:   HTN (hypertension)   Cocaine abuse   Tobacco abuse   CKD (chronic kidney disease) stage 3, GFR 30-59 ml/min   Left arm pain  Ms. Nixon is a 60 yo female with PMHx of HTN, Tobacco abuse, Cocaine abuse, reported Bipolar disorder with history of suicidal  attempt who presents to the ED after being unresponsive.   Altered Mental Status: Tracey Nixon presents after a questionable syncopal episode followed by unresponsiveness. Code Stroke cancelled. It seems as if Tracey Nixon was purposeful unresponsive on physical exam until Tracey Nixon sporadically came out of unresponsiveness. Differential includes conversion disorder, psychiatric disorder, cocaine-related, sub-clinical seizure, or TIA (less likely). Tracey Nixon was seen Tracey evaluated by Neurology after normal CT Head who did not feel this was an acute CVA; however, neurology did recommend EEG Tracey MRI. Tracey Nixon exam is non-focal. Tracey Nixon may have had a conversion reaction due to pain. Tracey Nixon denies any recent traumatic experiences, but does admit that Tracey Nixon has a long history of psychiatric disorders Tracey is no longer on medications.  -Neurology following appreciate recommendations -Obtain EEG r/o seizure -Obtain MRI  -Follow clinically Tracey treat RUE pain as below -Neuro checks  -Telemetry (d/c if MRI negative)  RUE Pain: Tracey Nixon presents with one day history of RUE edema, erythema Tracey tenderness. Clinical exam is concerning for DVT, thrombophlebitis, or cellulitis. Will obtain RUE duplex ultrasound. If negative, would consider treatment for cellulitis.  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  -Obtain RUE doppler ultrasound -If negative, consider treatment for cellulitis -Repeat CBC/BMET tomorrow am  HTN: Hypertensive in the 160-180s/80s. Tracey Nixon was previously on amlodipine 10 mg daily at home, but Tracey Nixon states Tracey Nixon has been off of Tracey Nixon medications for several months.  -Restart amlodipine 10 mg daily -Will need refills on discharge  CKD Stage III: Creatinine 1.4 on admission, likely chronic, but no priors for comparison.  -Repeat BMET tomorrow am  Tobacco Abuse: 100 pack year history.  -Smoking cessation counseling -Nicotine patch 21 mg daily  Cocaine Abuse: UDS positive for cocaine. Tracey Nixon admits they occasionally use cocaine, but Tracey Nixon is adamant that it has been a while for him- but not sure for Tracey Nixon. Tracey Nixon has likely use recently. Substance abuse is likely contributing to Tracey exacerbating underlying psychiatric problems.  -Counseled on abstinence  H/o of Psychiatric Disorder: Tracey Nixon states Tracey Nixon has a history of bipolar disorder Tracey other psychiatric issues he is unsure of. Tracey Nixon was previously on antidepressants, but has not been in a while. Tracey Nixon was also "court ordered" for 6 months 20 years ago due to history of suicidal attempt. Tracey Nixon was treated at [facility] for a prolonged grief reaction after Tracey Nixon mother died in 2012/11/19. Tracey Nixon was started on Valium at that time.  -Will need outpatient follow up with a PCP Tracey psychiatry  DVT/PE ppx: Lovenox SQ QD FEN: Regular CODE: Full  Dispo: Admit Tracey Nixon to Observation with expected length of stay less than 2 midnights.  Signed: Karlene Lineman, DO PGY-3 Internal Medicine Resident Pager # [phone] 01/18/2016 10:32 AM

## 2016-01-18 NOTE — ED Triage Notes (Signed)
Pt arrives via EMS as a Code stroke, LKW 0530. Pt went to bed at 0230 and had trouble falling asleep, slept for 30 minutes before getting up closer to five. Had a syncopal episode at 0530 and "never woke up" per EMS. Pt not verbally responsive at this time. IV established in the field.

## 2016-01-18 NOTE — Progress Notes (Signed)
Pt arrived to unit with no noted distress. Pt drowsy, but responsive. Follow simple commands. Complains of right arm pain. Repositioned for comfort during assessment.  Family at bedside stated that she got bit by something then she became altered mentally. Skin assessment done noted red bruising to right upper arm swollen, red, pain to touch. Safety measures in place. Call bell within reach. Will continue to monitor.

## 2016-01-18 NOTE — Consult Note (Signed)
Admission H&P    Chief Complaint: Altered mental status.  HPI: Tracey Nixon is an 60 y.o. female with a history of hypertension and noncompliance with recommended treatment brought to the emergency room and code stroke status. EMS was initially called because of swelling of her left arm with tingling. She subsequently became unresponsive. No focal deficits were noted. Patient would not follow any commands and did not respond to external stimuli. Spontaneous movements of extremities were noted bilaterally. CT scan of her head showed no acute intracranial abnormality. Patient was noted to have resistance to eye opening as well as responses to visual threat bilaterally. Muscle tone was equal and normal throughout. No gaze preference was noted.  Past Medical History:  Diagnosis Date  . Hypertension     Past Surgical History:  Procedure Laterality Date  . ABDOMINAL HYSTERECTOMY      No family history on file. Social History:  reports that she has been smoking Cigarettes.  She has been smoking about 0.50 packs per day. She has never used smokeless tobacco. She reports that she does not drink alcohol or use drugs.  Allergies:  Allergies  Allergen Reactions  . Darvocet [Propoxyphene N-Acetaminophen] Other (See Comments)    Cardiac arrest  . Demerol [Meperidine]     Cardiac arrest    Medications: None prior to admission.  ROS: History obtained from spouse  General ROS: negative for - chills, fatigue, fever, night sweats, weight gain or weight loss Psychological ROS: negative for - behavioral disorder, hallucinations, memory difficulties, mood swings or suicidal ideation Ophthalmic ROS: negative for - blurry vision, double vision, eye pain or loss of vision ENT ROS: negative for - epistaxis, nasal discharge, oral lesions, sore throat, tinnitus or vertigo Allergy and Immunology ROS: negative for - hives or itchy/watery eyes Hematological and Lymphatic ROS: negative for - bleeding problems,  bruising or swollen lymph nodes Endocrine ROS: negative for - galactorrhea, hair pattern changes, polydipsia/polyuria or temperature intolerance Respiratory ROS: negative for - cough, hemoptysis, shortness of breath or wheezing Cardiovascular ROS: negative for - chest pain, dyspnea on exertion, edema or irregular heartbeat Gastrointestinal ROS: negative for - abdominal pain, diarrhea, hematemesis, nausea/vomiting or stool incontinence Genito-Urinary ROS: negative for - dysuria, hematuria, incontinence or urinary frequency/urgency Musculoskeletal ROS: negative for - joint swelling or muscular weakness Neurological ROS: as noted in HPI Dermatological ROS: negative for rash and skin lesion changes  Physical Examination: Blood pressure (!) 167/112, pulse 86, resp. rate 21, height 5\' 2"  (1.575 m), weight 84.4 kg (186 lb 1.1 oz), SpO2 99 %.  HEENT-  Normocephalic, no lesions, without obvious abnormality.  Normal external eye and conjunctiva.  Normal TM's bilaterally.  Normal auditory canals and external ears. Normal external nose, mucus membranes and septum.  Normal pharynx. Neck supple with no masses, nodes, nodules or enlargement. Cardiovascular - regular rate and rhythm, S1, S2 normal, no murmur, click, rub or gallop Lungs - chest clear, no wheezing, rales, normal symmetric air entry Abdomen - soft, non-tender; bowel sounds normal; no masses,  no organomegaly Extremities - redness and swelling of left forearm medially was noted  Neurologic Examination: Mental Status: Patient was unresponsive to verbal and tactile stimulation. She actively resisted eye opening and head movement, as well as movement of extremities. She did not follow any commands.  Cranial Nerves: II-Reacted to visual threat from each side. III/IV/VI-Pupils were equal and reacted. Extraocular movements were full and conjugate with oculocephalic maneuvers. He should also had spontaneous eye movements which were conjugate. VII- no  facial weakness. X-no speech output. Motor: Normal muscle tone throughout; equal strength of upper extremities; no voluntary movement of lower extremities. Deep Tendon Reflexes: 1+ and symmetric. Plantars: Flexor bilaterally Cerebellar: Normal finger-to-nose testing. Carotid auscultation: Normal  Results for orders placed or performed during the hospital encounter of 01/18/16 (from the past 48 hour(s))  Protime-INR     Status: None   Collection Time: 01/18/16  6:09 AM  Result Value Ref Range   Prothrombin Time 15.0 11.4 - 15.2 seconds   INR 1.17   APTT     Status: Abnormal   Collection Time: 01/18/16  6:09 AM  Result Value Ref Range   aPTT 42 (H) 24 - 36 seconds    Comment:        IF BASELINE aPTT IS ELEVATED, SUGGEST PATIENT RISK ASSESSMENT BE USED TO DETERMINE APPROPRIATE ANTICOAGULANT THERAPY.   CBC     Status: Abnormal   Collection Time: 01/18/16  6:09 AM  Result Value Ref Range   WBC 11.0 (H) 4.0 - 10.5 K/uL   RBC 4.52 3.87 - 5.11 MIL/uL   Hemoglobin 13.1 12.0 - 15.0 g/dL   HCT 16.1 09.6 - 04.5 %   MCV 87.4 78.0 - 100.0 fL   MCH 29.0 26.0 - 34.0 pg   MCHC 33.2 30.0 - 36.0 g/dL   RDW 40.9 (H) 81.1 - 91.4 %   Platelets 371 150 - 400 K/uL  Differential     Status: Abnormal   Collection Time: 01/18/16  6:09 AM  Result Value Ref Range   Neutrophils Relative % 71 %   Neutro Abs 7.8 (H) 1.7 - 7.7 K/uL   Lymphocytes Relative 21 %   Lymphs Abs 2.4 0.7 - 4.0 K/uL   Monocytes Relative 6 %   Monocytes Absolute 0.6 0.1 - 1.0 K/uL   Eosinophils Relative 2 %   Eosinophils Absolute 0.2 0.0 - 0.7 K/uL   Basophils Relative 0 %   Basophils Absolute 0.0 0.0 - 0.1 K/uL  I-stat troponin, ED (not at [facility], [facility])     Status: None   Collection Time: 01/18/16  6:12 AM  Result Value Ref Range   Troponin i, poc 0.00 0.00 - 0.08 ng/mL   Comment 3            Comment: Due to the release kinetics of cTnI, a negative result within the first hours of the onset of symptoms does not rule  out myocardial infarction with certainty. If myocardial infarction is still suspected, repeat the test at appropriate intervals.   I-Stat Chem 8, ED  (not at [facility], [facility])     Status: Abnormal   Collection Time: 01/18/16  6:19 AM  Result Value Ref Range   Sodium 139 135 - 145 mmol/L   Potassium 3.6 3.5 - 5.1 mmol/L   Chloride 104 101 - 111 mmol/L   BUN 23 (H) 6 - 20 mg/dL   Creatinine, Ser 7.82 (H) 0.44 - 1.00 mg/dL   Glucose, Bld 956 (H) 65 - 99 mg/dL   Calcium, Ion 2.13 (L) 1.12 - 1.23 mmol/L   TCO2 23 0 - 100 mmol/L   Hemoglobin 14.3 12.0 - 15.0 g/dL   HCT 08.6 57.8 - 46.9 %   No results found.  Assessment/Plan 60 year old lady with history of hypertension and noncompliance with treatment presenting with altered mental status of unclear etiology. Clinical exam as well as CT showed no signs of an acute CNS deficit. Stroke or TIA is unlikely. It appears to be significant  psychophysiologic factors contributing to this patient's symptomatology, as well.  Recommendations: 1. MRI of the brain without contrast 2. EEG, routine adult study 3. Urine drug screen  We will continue to follow this patient with you.  C.R. Roseanne Reno, MD [facility] [phone]  01/18/2016, 6:36 AM

## 2016-01-18 NOTE — ED Notes (Signed)
MD at bedside, plan to admit. Pt responsive to to ammonia pellet.

## 2016-01-18 NOTE — Progress Notes (Signed)
EEG Completed; Results Pending  

## 2016-01-18 NOTE — ED Triage Notes (Signed)
Code stroke canceled at this time by Dr. Roseanne Reno.

## 2016-01-18 NOTE — ED Notes (Signed)
Updated pt. And family on Plan of care.   Pt. 's family given bed number

## 2016-01-18 NOTE — ED Notes (Signed)
Pt. s speech is clear.  Answers questions appropriately.    Pt. Stated, "Im cold and I want to go home. "  Warm blankets given to pt.  Pt. Stated, "My arm hurts where is is red, I was bitten by something."

## 2016-01-19 ENCOUNTER — Observation Stay (HOSPITAL_BASED_OUTPATIENT_CLINIC_OR_DEPARTMENT_OTHER): Payer: Self-pay

## 2016-01-19 DIAGNOSIS — M7989 Other specified soft tissue disorders: Secondary | ICD-10-CM

## 2016-01-19 DIAGNOSIS — F449 Dissociative and conversion disorder, unspecified: Secondary | ICD-10-CM

## 2016-01-19 DIAGNOSIS — F1410 Cocaine abuse, uncomplicated: Secondary | ICD-10-CM

## 2016-01-19 LAB — CBC
HEMATOCRIT: 40 % (ref 36.0–46.0)
HEMOGLOBIN: 12.3 g/dL (ref 12.0–15.0)
MCH: 27.6 pg (ref 26.0–34.0)
MCHC: 30.8 g/dL (ref 30.0–36.0)
MCV: 89.9 fL (ref 78.0–100.0)
Platelets: 375 10*3/uL (ref 150–400)
RBC: 4.45 MIL/uL (ref 3.87–5.11)
RDW: 16.5 % — AB (ref 11.5–15.5)
WBC: 7.9 10*3/uL (ref 4.0–10.5)

## 2016-01-19 LAB — BASIC METABOLIC PANEL
Anion gap: 5 (ref 5–15)
BUN: 15 mg/dL (ref 6–20)
CHLORIDE: 111 mmol/L (ref 101–111)
CO2: 23 mmol/L (ref 22–32)
Calcium: 8.6 mg/dL — ABNORMAL LOW (ref 8.9–10.3)
Creatinine, Ser: 1.23 mg/dL — ABNORMAL HIGH (ref 0.44–1.00)
GFR calc Af Amer: 53 mL/min — ABNORMAL LOW (ref >60)
GFR, EST NON AFRICAN AMERICAN: 46 mL/min — AB (ref >60)
GLUCOSE: 113 mg/dL — AB (ref 65–99)
Potassium: 3.7 mmol/L (ref 3.5–5.1)
Sodium: 139 mmol/L (ref 135–145)

## 2016-01-19 LAB — LIPID PANEL
CHOL/HDL RATIO: 4.2 ratio
Cholesterol: 152 mg/dL (ref 0–200)
HDL: 36 mg/dL — AB (ref >40)
LDL CALC: 97 mg/dL (ref 0–99)
Triglycerides: 97 mg/dL (ref <150)
VLDL: 19 mg/dL (ref 0–40)

## 2016-01-19 MED ORDER — METHOCARBAMOL 500 MG PO TABS: 500.0000 mg | ORAL_TABLET | Freq: Two times a day (BID) | ORAL | 2 refills | Status: DC

## 2016-01-19 MED ORDER — AMLODIPINE BESYLATE 10 MG PO TABS: 10.0000 mg | ORAL_TABLET | Freq: Every day | ORAL | 2 refills | Status: DC

## 2016-01-19 NOTE — CV Procedure (Signed)
History: Tracey Nixon is a 60 year old patient with a history of events suspicious for possible seizure activity.  Sedation: None  Technique: This is a 21 channel routine scalp EEG performed at the bedside with bipolar and monopolar montages arranged in accordance to the international 10/20 system of electrode placement. One channel was dedicated to EKG recording.    Background: The background consists of intermixed alpha and beta activities. There is a well defined posterior dominant rhythm of 11-12 Hz that attenuates with eye opening. Sleep is recorded with normal appearing structures. Tracey Nixon was noted to transition into light drowsiness and transient slow waves appeared. Eye movement and eye blink artifacts were noted.  Photic stimulation: Physiologic driving is negative  EEG Abnormalities: None  Clinical Interpretation: This normal EEG is recorded in the waking and drowsy state. There was no seizure or seizure predisposition recorded on this study. Please note that a normal EEG does not preclude the possibility of epilepsy.   Dr. Rudy Jew. Hilda Blades, MD Neurohospitalist

## 2016-01-19 NOTE — Progress Notes (Signed)
Subjective: Tracey Nixon is feeling much better today and has had no issues whatsoever overnight. She was back to her usual self yesterday afternoon and remains so this morning according to her husband at bedside. The swelling on her right upper arm has improved and the tenderness to palpation is nearly absent. She is very interested in going home to see her cats and she says she feels quite her usual self. We discussed the normal work up to date with the patient and her husband and potential explanation of conversion disorder-type episode. They were reassured by these results and agreeable to discharge home today.  Objective: Vital signs in last 24 hours: Vitals:   01/19/16 0133 01/19/16 0602 01/19/16 0800 01/19/16 0946  BP: (!) 112/51 (!) 116/47 (!) 120/44 127/67  Pulse: (!) 57 70 (!) 56 66  Resp: 16 16 18 18   Temp: 97.9 F (36.6 C) 97.9 F (36.6 C) 98.4 F (36.9 C) 98.5 F (36.9 C)  TempSrc: Oral Oral Oral Oral  SpO2: 97% 97% 97% 96%  Weight:      Height:       No intake or output data in the 24 hours ending 01/19/16 1009  Physical Exam General appearance: Disheveled elderly female lying in bed, appears older than stated age, conversational Cardiovascular: Regular rate and rhythm, no murmurs, rubs, gallops Respiratory/Chest: Clear to ausculation bilaterally, normal work of breathing Abdomen: Bowel sounds present, soft, non-tender, non-distended Extremities: Mild swelling, faint erythema, and mild TTP over R epitrochlear region and R anterior axillary fold, otherwise normal bulk and ROM, symmetric bilaterally, 2+ distal pulses Skin: Warm, dry, intact, two 1-2 cm bruises over medial right upper arm and some ecchymoses over right antecubital fossa at previous IV site Neuro: Cranial nerves grossly intact, alert and oriented, intermittent choreiform movements of upper extremities and face Psych: Strange affect, tangential in conversation  Labs / Imaging / Procedures: CBC Latest Ref  Rng & Units 01/19/2016 01/18/2016 01/18/2016  WBC 4.0 - 10.5 K/uL 7.9 - 11.0(H)  Hemoglobin 12.0 - 15.0 g/dL 37.6 28.3 15.1  Hematocrit 36.0 - 46.0 % 40.0 42.0 39.5  Platelets 150 - 400 K/uL 375 - 371   BMP Latest Ref Rng & Units 01/19/2016 01/18/2016 01/18/2016  Glucose 65 - 99 mg/dL 761(Y) 073(X) 106(Y)  BUN 6 - 20 mg/dL 15 69(S) 20  Creatinine 0.44 - 1.00 mg/dL 8.54(O) 2.70(J) 5.00(X)  Sodium 135 - 145 mmol/L 139 139 136  Potassium 3.5 - 5.1 mmol/L 3.7 3.6 3.7  Chloride 101 - 111 mmol/L 111 104 103  CO2 22 - 32 mmol/L 23 - 21(L)  Calcium 8.9 - 10.3 mg/dL 3.8(H) - 8.9   Mr Brain Wo Contrast  Result Date: 01/18/2016 CLINICAL DATA:  Acute onset of unresponsiveness today. EXAM: MRI HEAD WITHOUT CONTRAST TECHNIQUE: Multiplanar, multiecho pulse sequences of the brain and surrounding structures were obtained without intravenous contrast. COMPARISON:  CT same day FINDINGS: Axial diffusion only obtained because of the patient's tenuous clinical status. Diffusion imaging is normal. Note that the inferior cerebellum and medulla are not completely included on the exam. T2 information suggests moderate to marked chronic small-vessel ischemic changes throughout the cerebral hemispheric white matter. No hydrocephalus. No extra-axial collection. No sign of hemorrhage. Sinuses are clear. IMPRESSION: Axial diffusion imaging negative for acute or subacute infarction. Chronic small-vessel ischemic changes affecting the cerebral hemispheric white matter. Electronically Signed   By: Paulina Fusi M.D.   On: 01/18/2016 15:42  Assessment/Plan: Tracey Nixon is a 60 y.o. woman  with PMH HTN, CKD3, cocaine abuse, bipolar disorder admitted for episode of unresponsiveness at home, apparently psychogenic in nature.   AMS, resolved, likely conversion reaction to pain vs psychiatric disorder vs cocaine-related, etiology remains unclear but CT head normal, MRI unremarkable except for microvascular ischemic changes, labs normal,  neuro exam non-focal, exam maneuvers during episode of "unresponsiveness" suggestive of conscious/alertness and psychosomatic etiology (avoids hand dropped on face, avoids foul odor, resistance to eye-opening with blink to threat bialterally)   - Follow EEG results  - F/up psychiatric disorders with PCP  RUE pain, likely mild skin insult triggered RUE adenopathy, nearly resolved, exam not concerning for cellulitis, DVT, afebrile, no leukocytosis, minimal concern for infection  - Follow RUE duplex US result if obtained before discharge  - F/up with PCP  Dispo: Anticipated discharge today.   LOS: 0 days   Althia Forts, MD 01/19/2016, 10:09 AM Pager: [phone]

## 2016-01-19 NOTE — Progress Notes (Signed)
*  PRELIMINARY RESULTS* Vascular Ultrasound Right upper extremity venous duplex has been completed.  Preliminary findings: No evidence of DVT or superficial thrombosis.  Farrel Demark, RDMS, RVT  01/19/2016, 10:31 AM

## 2016-01-19 NOTE — Progress Notes (Signed)
Pt discharging with husband at this time taking all personal belongings. IV discontinued, dry dressing applied. Discharge instructions provided with verbal understanding. No noted distress. Pt denies pain or discomfort.

## 2016-01-21 LAB — HEMOGLOBIN A1C
HEMOGLOBIN A1C: 5.5 % (ref 4.8–5.6)
MEAN PLASMA GLUCOSE: 111 mg/dL

## 2016-01-22 NOTE — Discharge Summary (Signed)
Name: Tracey Nixon MRN: [id number] DOB: 1955/12/17 60 y.o. PCP: Provider Default, MD  Date of Admission: 01/18/2016  6:06 AM Date of Discharge: 01/19/2016 Attending Physician: Doneen Poisson, MD  Discharge Diagnosis: 1. Possible conversion disorder  Principal Problem:   Altered mental status Active Problems:   HTN (hypertension)   Cocaine abuse   Tobacco abuse   CKD (chronic kidney disease) stage 3, GFR 30-59 ml/min   Left arm pain   Discharge Medications:   Medication List    TAKE these medications   amLODipine 10 MG tablet Commonly known as:  NORVASC Take 1 tablet (10 mg total) by mouth daily.   methocarbamol 500 MG tablet Commonly known as:  ROBAXIN Take 1 tablet (500 mg total) by mouth 2 (two) times daily.       Disposition and follow-up:   [name] was discharged from [facility] in Stable condition.  At the hospital follow up visit please address:  1.  Possible conversion disorder - "unresponsive episode" potentially triggered by arm pain, intact neuro exam and normal neuroimaging and "awoke" spontaneously upon husbands request, history of bipolar disorder and was taking medications previously, off meds for last 6 months, likely requires psychiatric evaluation  Cocaine use - UDS positive for cocaine, cessation strongly recommended  2.  Labs / imaging needed at time of follow-up: None   3.  Pending labs/ test needing follow-up: None  Follow-up Appointments: Follow-up Information    [facility]. Schedule an appointment as soon as possible for a visit in 2 week(s).   Contact information: [address] [place] [address] [phone]          Hospital Course by problem list: Principal Problem:   Altered mental status Active Problems:   HTN (hypertension)   Cocaine abuse   Tobacco abuse   CKD (chronic kidney disease) stage 3, GFR 30-59 ml/min   Left arm pain   1.  Possible conversion disorder, triggered by minor RUE injury - patient presented on 7/28 via EMS several hours after suddenly going unresponsive at home. Patient reportedly has been gardening and was pricked/biten on her right arm they day prior, overnight the pain worsened and she told her husband to call 911 before slumping on the couch. Transported to ED unresponsive, two 1cm bruises of RUE with mild swelling distal and proximal, code stroke initially called but then cancelled when neurologic exam revealed her to be neurologically  intact/conscious suggestive of a conversion disorder. Labs unremarkable except UDS positive for cocaine. Patient's husband requested that she wake up in the ED before further interventions took place and the patient immediately complied. Patient was awake and at baseline per husband since PM of 7/28. CT head and MRI on 7/28 were negative for intracranial abnormality, and EEG negative for seizure and RUE duplex ultrasound negative for DVT on 7/29. Patient was stable at baseline for >24 hrs, HDS, no discernible lab/physical abnormalities, and discharged home on 7/29.  Discharge Vitals:   BP 127/67 (BP Location: Left Arm)   Pulse 66   Temp 98.5 F (36.9 C) (Oral)   Resp 18   Ht  (1.575 m)   Wt 181 lb 7 oz (82.3 kg)   SpO2 96%   BMI 33.19 kg/m   Pertinent Labs, Studies, and Procedures:  CBC Latest Ref Rng & Units 01/19/2016 01/18/2016 01/18/2016  WBC 4.0 - 10.5 K/uL 7.9 - 11.0(H)  Hemoglobin 12.0 - 15.0 g/dL 86.5 78.4 69.6  Hematocrit 36.0 - 46.0 % 40.0 42.0 39.5  Platelets 150 - 400 K/uL 375 - 371   BMP Latest Ref Rng & Units 01/19/2016 01/18/2016 01/18/2016  Glucose 65 - 99 mg/dL 557(D) 220(U) 542(H)  BUN 6 - 20 mg/dL 15 06(C) 20  Creatinine 0.44 - 1.00 mg/dL 3.76(E) 8.31(D) 1.76(H)  Sodium 135 - 145 mmol/L 139 139 136  Potassium 3.5 - 5.1 mmol/L 3.7 3.6 3.7  Chloride 101 - 111 mmol/L 111 104 103  CO2 22 - 32 mmol/L 23 - 21(L)  Calcium 8.9 - 10.3 mg/dL 6.0(V) -  8.9   Drugs of Abuse     Component Value Date/Time   LABOPIA NONE DETECTED 01/18/2016 0637   COCAINSCRNUR POSITIVE (A) 01/18/2016 0637   LABBENZ NONE DETECTED 01/18/2016 0637   AMPHETMU NONE DETECTED 01/18/2016 0637   THCU NONE DETECTED 01/18/2016 0637   LABBARB NONE DETECTED 01/18/2016 0637    MR Brain Wo Con 01/18/2016 IMPRESSION: Axial diffusion imaging negative for acute or subacute infarction. Chronic small-vessel ischemic changes affecting the cerebral hemispheric white matter.  CT Head Wo Con 01/18/2016 IMPRESSION: 1. No acute intracranial process identified. 2. Age-related cerebral atrophy with moderate chronic microvascular ischemic disease.  Discharge Instructions: Discharge Instructions    Call MD for:  difficulty breathing, headache or visual disturbances    Complete by:  As directed   Call MD for:  extreme fatigue    Complete by:  As directed   Call MD for:  persistant dizziness or light-headedness    Complete by:  As directed   Call MD for:  persistant nausea and vomiting    Complete by:  As directed   Call MD for:  temperature >100.4    Complete by:  As directed   Diet - low sodium heart healthy    Complete by:  As directed   Discharge instructions    Complete by:  As directed   Please continue to take your medications as prescribed and schedule a hospital follow up visit with our [facility]. It is important for your health to establish care and visit a doctor regularly to manage your medical and psychiatric needs. Please quit smoking and do not take recreational drugs - these are a danger to your health.  Please visit the ER immediately if you develop sudden weakness, facial droop, or difficulty speaking - as this may be a stroke.   Increase activity slowly    Complete by:  As directed      Signed: Althia Forts, MD 01/22/2016, 3:21 PM   Pager: [phone]

## 2016-01-24 ENCOUNTER — Encounter (HOSPITAL_COMMUNITY): Payer: Self-pay | Admitting: *Deleted

## 2016-01-24 ENCOUNTER — Emergency Department (HOSPITAL_COMMUNITY)
Admission: EM | Admit: 2016-01-24 | Discharge: 2016-01-24 | Disposition: A | Discharge status: Home / Self Care | Payer: Self-pay | Attending: Emergency Medicine | Admitting: Emergency Medicine

## 2016-01-24 DIAGNOSIS — I129 Hypertensive chronic kidney disease with stage 1 through stage 4 chronic kidney disease, or unspecified chronic kidney disease: Secondary | ICD-10-CM | POA: Insufficient documentation

## 2016-01-24 DIAGNOSIS — N183 Chronic kidney disease, stage 3 (moderate): Secondary | ICD-10-CM | POA: Insufficient documentation

## 2016-01-24 DIAGNOSIS — F17210 Nicotine dependence, cigarettes, uncomplicated: Secondary | ICD-10-CM | POA: Insufficient documentation

## 2016-01-24 DIAGNOSIS — M79604 Pain in right leg: Secondary | ICD-10-CM

## 2016-01-24 DIAGNOSIS — M79661 Pain in right lower leg: Secondary | ICD-10-CM | POA: Insufficient documentation

## 2016-01-24 DIAGNOSIS — M25561 Pain in right knee: Secondary | ICD-10-CM | POA: Insufficient documentation

## 2016-01-24 LAB — BASIC METABOLIC PANEL
ANION GAP: 8 (ref 5–15)
BUN: 19 mg/dL (ref 6–20)
CALCIUM: 9 mg/dL (ref 8.9–10.3)
CO2: 22 mmol/L (ref 22–32)
Chloride: 106 mmol/L (ref 101–111)
Creatinine, Ser: 1.24 mg/dL — ABNORMAL HIGH (ref 0.44–1.00)
GFR, EST AFRICAN AMERICAN: 53 mL/min — AB (ref >60)
GFR, EST NON AFRICAN AMERICAN: 46 mL/min — AB (ref >60)
Glucose, Bld: 112 mg/dL — ABNORMAL HIGH (ref 65–99)
Potassium: 3.9 mmol/L (ref 3.5–5.1)
SODIUM: 136 mmol/L (ref 135–145)

## 2016-01-24 LAB — CBC
HCT: 41.3 % (ref 36.0–46.0)
HEMOGLOBIN: 13 g/dL (ref 12.0–15.0)
MCH: 28.3 pg (ref 26.0–34.0)
MCHC: 31.5 g/dL (ref 30.0–36.0)
MCV: 89.8 fL (ref 78.0–100.0)
PLATELETS: 445 10*3/uL — AB (ref 150–400)
RBC: 4.6 MIL/uL (ref 3.87–5.11)
RDW: 16.4 % — ABNORMAL HIGH (ref 11.5–15.5)
WBC: 12.3 10*3/uL — AB (ref 4.0–10.5)

## 2016-01-24 MED ORDER — OXYCODONE-ACETAMINOPHEN 5-325 MG PO TABS: 1.0000 | ORAL_TABLET | Freq: Four times a day (QID) | ORAL | 0 refills | Status: DC | PRN

## 2016-01-24 MED ORDER — OXYCODONE-ACETAMINOPHEN 5-325 MG PO TABS
1.0000 | ORAL_TABLET | Freq: Once | ORAL | Status: AC
  Administered 2016-01-24: 1 via ORAL
  Filled 2016-01-24: qty 1

## 2016-01-24 MED ORDER — OXYCODONE-ACETAMINOPHEN 5-325 MG PO TABS
1.0000 | ORAL_TABLET | ORAL | Status: AC | PRN
  Administered 2016-01-24 (×2): 1 via ORAL
  Filled 2016-01-24: qty 1

## 2016-01-24 MED ORDER — OXYCODONE-ACETAMINOPHEN 5-325 MG PO TABS
ORAL_TABLET | ORAL | Status: AC
  Administered 2016-01-24: 1 via ORAL
  Filled 2016-01-24: qty 1

## 2016-01-24 NOTE — ED Provider Notes (Signed)
[facility]-EMERGENCY DEPT Provider Note   CSN: [id number] Arrival date & time: 01/24/16  1440  First Provider Contact:  None     History   Chief Complaint Chief Complaint  Patient presents with  . Leg Pain    HPI Tracey Nixon is a 60 y.o. female.   Leg Pain   This is a new problem. The current episode started more than 2 days ago. The problem occurs constantly. The problem has been gradually worsening. The pain is present in the right knee and right lower leg. The quality of the pain is described as sharp. The pain is severe. Associated symptoms include limited range of motion and stiffness. Pertinent negatives include no numbness and no itching. The symptoms are aggravated by standing, activity and contact. She has tried rest for the symptoms. The treatment provided no relief. There has been no history of extremity trauma.    Past Medical History:  Diagnosis Date  . Hypertension     Patient Active Problem List   Diagnosis Date Noted  . Altered mental status 01/18/2016  . HTN (hypertension) 01/18/2016  . Cocaine abuse 01/18/2016  . Tobacco abuse 01/18/2016  . CKD (chronic kidney disease) stage 3, GFR 30-59 ml/min 01/18/2016  . Left arm pain 01/18/2016    Past Surgical History:  Procedure Laterality Date  . ABDOMINAL HYSTERECTOMY      OB History    No data available       Home Medications    Prior to Admission medications   Medication Sig Start Date End Date Taking? Authorizing Provider  amLODipine (NORVASC) 10 MG tablet Take 1 tablet (10 mg total) by mouth daily. 01/19/16   Althia Forts, MD  methocarbamol (ROBAXIN) 500 MG tablet Take 1 tablet (500 mg total) by mouth 2 (two) times daily. 01/19/16   Althia Forts, MD    Family History History reviewed. No pertinent family history.  Social History Social History  Substance Use Topics  . Smoking status: Current Every Day Smoker    Packs/day: 0.50    Types: Cigarettes  . Smokeless tobacco: Never Used  . Alcohol use  No     Allergies   Darvocet [propoxyphene n-acetaminophen] and Demerol [meperidine]   Review of Systems Review of Systems  Constitutional: Negative for chills and fever.  HENT: Negative.   Eyes: Negative.   Respiratory: Negative for shortness of breath.   Cardiovascular: Negative for chest pain.  Gastrointestinal: Negative for abdominal pain, nausea and vomiting.  Genitourinary: Negative.   Musculoskeletal: Positive for stiffness. Negative for arthralgias, back pain and neck pain.       No pain anywhere else asides from the R knee and R calf  Skin: Negative for itching and rash.  Neurological: Negative for syncope and numbness.  Psychiatric/Behavioral: Positive for agitation (2/2 pain). Negative for confusion. The patient is nervous/anxious.      Physical Exam Updated Vital Signs BP 128/66 (BP Location: Left Arm)   Pulse 68   Temp 98.4 F (36.9 C) (Oral)   Resp 18   Ht  (1.6 m)   Wt 76.2 kg   SpO2 98%   BMI 29.76 kg/m   Physical Exam  Constitutional: She is oriented to person, place, and time. She appears well-developed and well-nourished. She appears distressed (mod to severe).  HENT:  Head: Normocephalic and atraumatic.  Eyes: Conjunctivae are normal. Right eye exhibits no discharge. Left eye exhibits no discharge.  Neck: Normal range of motion. Neck supple. No tracheal deviation present.  Cardiovascular:  Normal rate, regular rhythm, normal heart sounds and intact distal pulses.   No murmur heard. Pulmonary/Chest: Effort normal and breath sounds normal. No stridor. No respiratory distress. She has no wheezes. She has no rales.  Abdominal: Soft. Bowel sounds are normal. She exhibits no distension. There is no tenderness. There is no rebound and no guarding.  Musculoskeletal: She exhibits edema (R knee and R calf) and tenderness (R knee and R calf). She exhibits no deformity.  Limited ROM in R knee 2/2 swelling and pain  Neurological: She is alert and oriented  to person, place, and time.  Skin: Skin is warm and dry. Capillary refill takes less than 2 seconds. No rash noted. She is not diaphoretic. No erythema.  Nursing note and vitals reviewed.    ED Treatments / Results  Labs (all labs ordered are listed, but only abnormal results are displayed) Labs Reviewed  BASIC METABOLIC PANEL - Abnormal; Notable for the following:       Result Value   Glucose, Bld 112 (*)    Creatinine, Ser 1.24 (*)    GFR calc non Af Amer 46 (*)    GFR calc Af Amer 53 (*)    All other components within normal limits  CBC - Abnormal; Notable for the following:    WBC 12.3 (*)    RDW 16.4 (*)    Platelets 445 (*)    All other components within normal limits    EKG  EKG Interpretation None       Radiology No results found.  Procedures Procedures (including critical care time)  Medications Ordered in ED Medications  oxyCODONE-acetaminophen (PERCOCET/ROXICET) 5-325 MG per tablet 1 tablet (1 tablet Oral Given 01/24/16 1511)  oxyCODONE-acetaminophen (PERCOCET/ROXICET) 5-325 MG per tablet (not administered)     Initial Impression / Assessment and Plan / ED Course  I have reviewed the triage vital signs and the nursing notes.  Pertinent labs & imaging results that were available during my care of the patient were reviewed by me and considered in my medical decision making (see chart for details).  Clinical Course   60 year old female with no significant past medical history who presents to the emergency department because of worsening right lower extremity swelling and pain. Patient was recently hospitalized for altered mental status with a negative workup. Possible conversion disorder per chart reivew. She reports that she had started noticing some discomfort in her right calf at the time of discharge. pain and swelling has consistently been getting worse since she returned home, over the past 3 days. Patient denies any fevers, chills or night sweats making  an infectious cause less likely. There is also no overlying skin change or because of warmth noted. Patient does have restricted range of motion along the knee. Negative Homans sign. Left leg is unremarkable. During interview patient was asking multiple times for IV pain medications as well. Differential diagnosis includes DVT since patient was not sent home with Lovenox. No history of DVT or any concerns at this time for PE, including no chest pain or shortness of breath, no tachycardia, no hypoxia. No personal family history of blood clots. Patient denies any trauma making fracture unlikely. Basic blood work was sent and patient had a mild leukocytosis to 12. Patient was instructed to return in the morning and get a lower extremity duplex study performed. No suspicion for septic joint as well, however if DVT study is negative, would be reasonable to further evaluate this possibility. Patient was given Percocet with  adequate improvement in pain. After review of Narcotic database, patient was sent home with a prescription for 2 Percocets until DVT study was performed. Patient was also encouraged to elevate right lower extremity, not bear weight on it at this time, apply compression wrap to the hand and ice it for pain relief. Encouraged NSAIDs as well for pain control. Patient and husband agree with plan. All questions answered. Patient and VS stable at discharge.  Final Clinical Impressions(s) / ED Diagnoses   Final diagnoses:  Right leg pain    New Prescriptions Discharge Medication List as of 01/24/2016  9:23 PM    START taking these medications   Details  oxyCODONE-acetaminophen (PERCOCET/ROXICET) 5-325 MG tablet Take 1 tablet by mouth every 6 (six) hours as needed for severe pain., Starting Thu 01/24/2016, Print         Maretta Bees, MD 01/25/16 1426    Lavera Guise, MD 01/26/16 1049    Lavera Guise, MD 01/26/16 1050

## 2016-01-24 NOTE — ED Notes (Signed)
Pt d/c home with husband.  F/u thoroughly discussed.

## 2016-01-24 NOTE — Discharge Instructions (Signed)
IMPORTANT PATIENT INSTRUCTIONS:  You have been scheduled for an Outpatient Vascular Study at [facility].   ° °If tomorrow is a Saturday or Sunday, please go to the [facility] Registration Desk at 8 am tomorrow morning and tell them you are there for a vascular study.  If tomorrow is a weekday (Monday-Friday), please go to  Admitting Department at 8 am and tell them you are there for a vascular study. °

## 2016-01-24 NOTE — ED Triage Notes (Addendum)
Pt reports having right leg pain for several days, states the pain is severe and from knee down to her toes. Reports unable to ambulate due to pain, no weakness.  Recent hx of hospitalization after right arm pain and period of being unresponsive.

## 2016-01-25 ENCOUNTER — Ambulatory Visit (HOSPITAL_COMMUNITY)
Admission: RE | Admit: 2016-01-25 | Discharge: 2016-01-25 | Disposition: A | Discharge status: Home / Self Care | Payer: Self-pay | Source: Ambulatory Visit | Attending: Emergency Medicine | Admitting: Emergency Medicine

## 2016-01-25 DIAGNOSIS — M79609 Pain in unspecified limb: Secondary | ICD-10-CM

## 2016-01-25 DIAGNOSIS — M7989 Other specified soft tissue disorders: Secondary | ICD-10-CM | POA: Insufficient documentation

## 2016-01-25 NOTE — Progress Notes (Signed)
*  PRELIMINARY RESULTS* Vascular Ultrasound Right lower extremity venous duplex has been completed.  Preliminary findings: No evidence of DVT or baker's cyst.   Farrel Demark, RDMS, RVT  01/25/2016, 9:11 AM

## 2016-01-25 NOTE — ED Provider Notes (Signed)
I saw and evaluated the patient, reviewed the resident's note and I agree with the findings and plan.   EKG Interpretation None      60 year old female who presents with RLE pain and swelling. Recently hospitalized for AMS and right arm pain, with extensive work-up and no clear etiology of symptoms. Inpatient team questions potential conversion disordered. States that she was bedbound primarily in hospital and developed mild pain and swelling behind right knee towards end of hospitalization, which worsened since discharge. No trauma, fall, or strenuous activity. No fever, chills, night sweats, chest pain, dyspnea, syncope or near syncope. Appears uncomfortable secondary to pain, but vital stable. Neurovascularly in tact RLE with mild tautness of skin of the right calf but no pitting edema or overlying skin changes. Compartments soft. Discussed concern for Bakers cyst vs DVT, and will send for outpatient US venous in the morning as outpatient. Prior prescriptions for narcotics reviewed on the CSRS database, and no significant prescriptions for one year. I have given her 2 tablets of percocet for pain control via prescription until she is able to get her LE venous US in the morning. Strict return and follow-up instructions reviewed. She expressed understanding of all discharge instructions and felt comfortable with the plan of care.     Lavera Guise, MD 01/25/16 [phone]

## 2017-10-01 IMAGING — MR MR HEAD W/O CM
2 series · 48 of 48 positions shown · non-contrast
Comparison: CT same day

CLINICAL DATA: Acute onset of unresponsiveness today.

EXAM:
MRI HEAD WITHOUT CONTRAST
TECHNIQUE: Multiplanar, multiecho pulse sequences of the brain and surrounding
structures were obtained without intravenous contrast.

[Series 3: DWI · axial · 3.0mm · 0.94mm/px · z∈[-90,+37]mm · 32 of 88 slices shown]
[im 1/88]
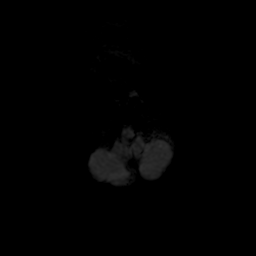
[im 3/88]
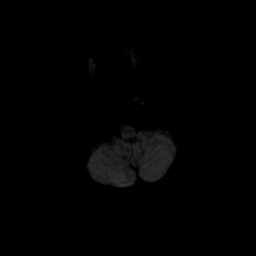
[im 6/88]
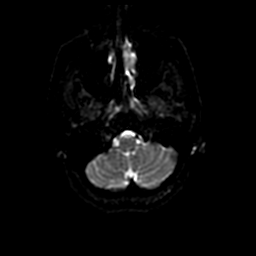
[im 9/88]
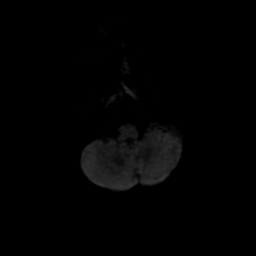
[im 12/88]
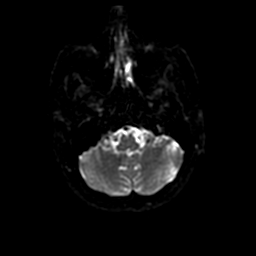
[im 15/88]
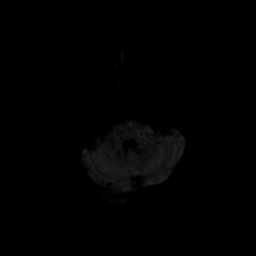
[im 17/88]
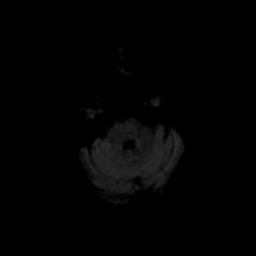
[im 20/88]
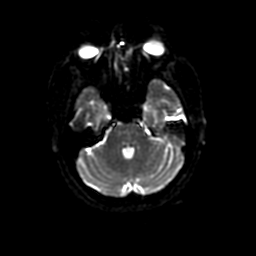
[im 23/88]
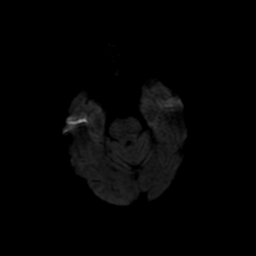
[im 26/88]
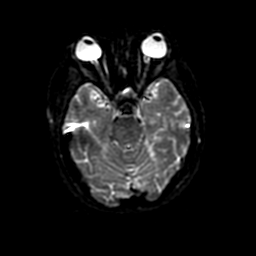
[im 29/88]
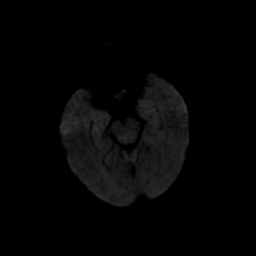
[im 31/88]
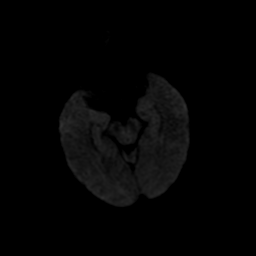
[im 34/88]
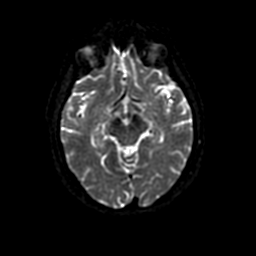
[im 37/88]
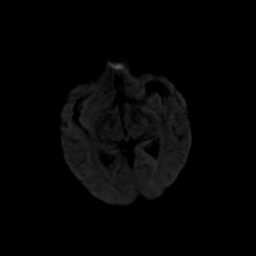
[im 40/88]
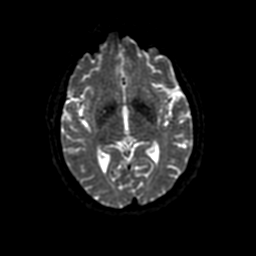
[im 43/88]
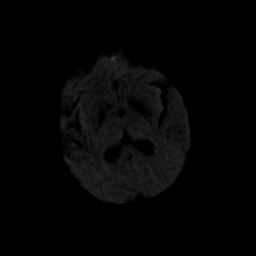
[im 45/88]
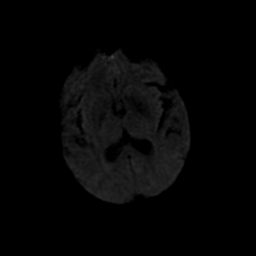
[im 48/88]
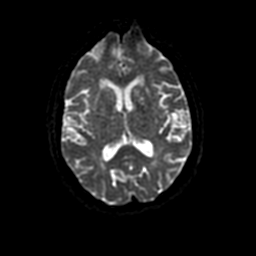
[im 51/88]
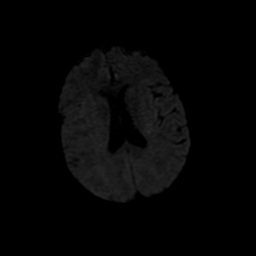
[im 54/88]
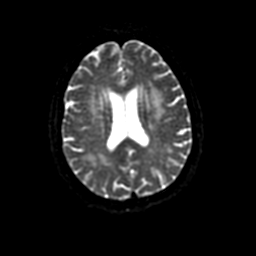
[im 57/88]
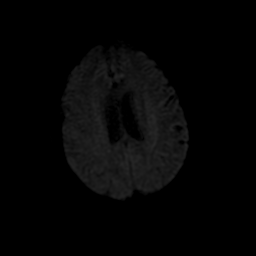
[im 59/88]
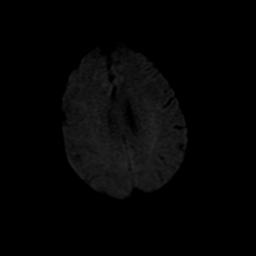
[im 62/88]
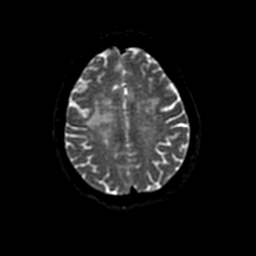
[im 65/88]
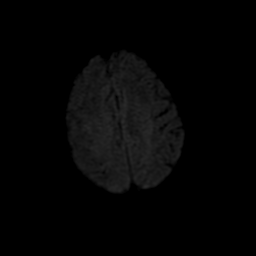
[im 68/88]
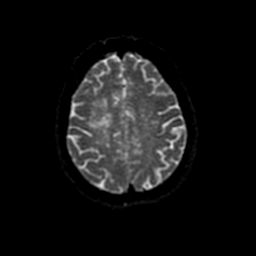
[im 71/88]
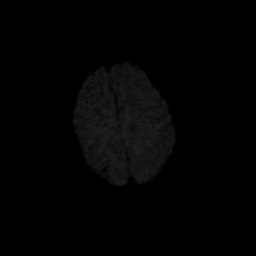
[im 73/88]
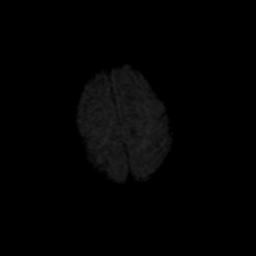
[im 76/88]
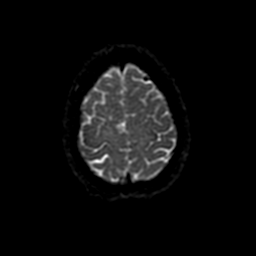
[im 79/88]
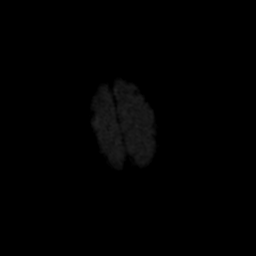
[im 82/88]
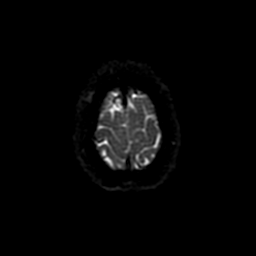
[im 85/88]
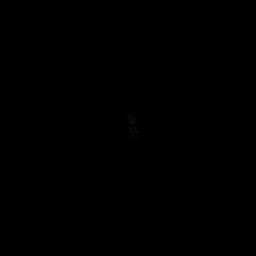
[im 88/88]
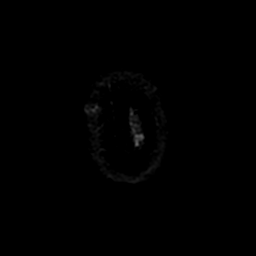

[Series 350: ADC · axial · 3.0mm · 0.94mm/px · z∈[-90,+37]mm · 16 of 44 slices shown]
[im 1/44]
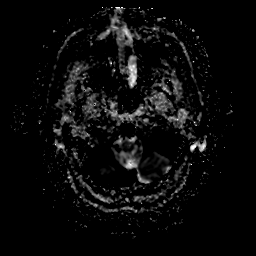
[im 3/44]
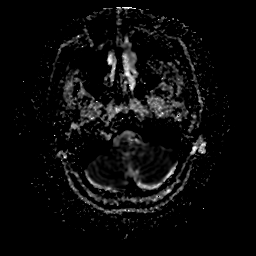
[im 6/44]
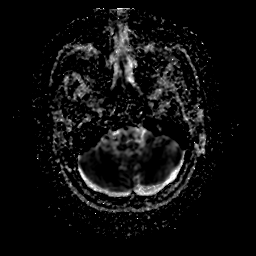
[im 9/44]
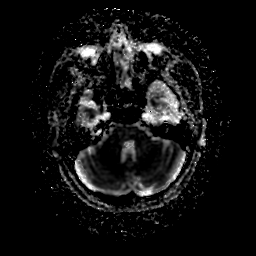
[im 12/44]
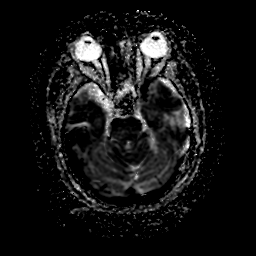
[im 15/44]
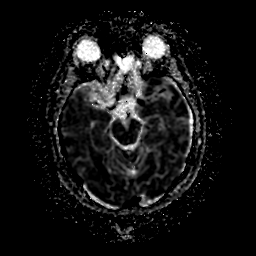
[im 18/44]
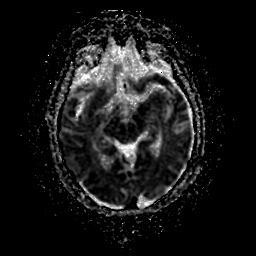
[im 21/44]
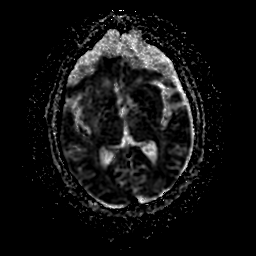
[im 23/44]
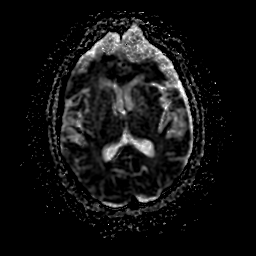
[im 26/44]
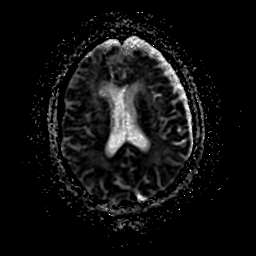
[im 29/44]
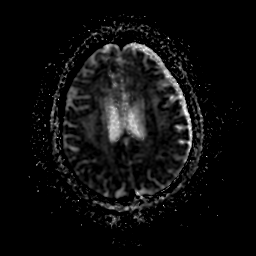
[im 32/44]
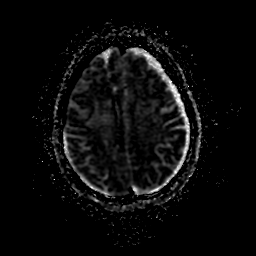
[im 35/44]
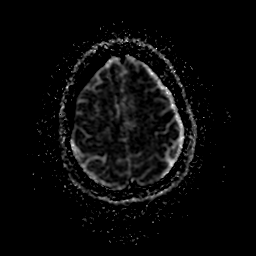
[im 38/44]
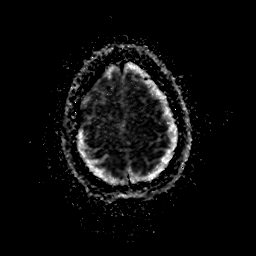
[im 41/44]
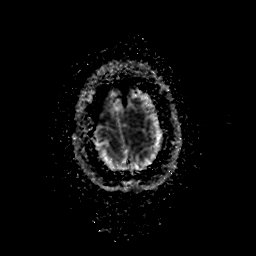
[im 44/44]
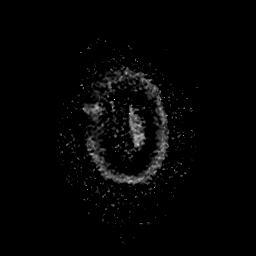

[48 of 48 positions shown; findings below may reference images not displayed]

FINDINGS: Axial diffusion only obtained because of the patient's tenuous
clinical status. Diffusion imaging is normal. Note that the inferior
cerebellum and medulla are not completely included on the exam. T2
information suggests moderate to marked chronic small-vessel
ischemic changes throughout the cerebral hemispheric white matter.
No hydrocephalus. No extra-axial collection. No sign of hemorrhage.
Sinuses are clear.
IMPRESSION: Axial diffusion imaging negative for acute or subacute infarction.

Chronic small-vessel ischemic changes affecting the cerebral
hemispheric white matter.

## 2017-10-01 IMAGING — CT CT HEAD W/O CM
3 of 7 series · 14 of 47 positions shown, 17 images · non-contrast
Comparison: None.

CLINICAL DATA: Initial evaluation for acute unresponsiveness.

EXAM:
CT HEAD WITHOUT CONTRAST
TECHNIQUE: Contiguous axial images were obtained from the base of the skull
through the vertex without intravenous contrast.

[Series 3: head 2.0 h70h · axial · 0.46mm/px · z∈[-100,+28]mm · 9 of 74 slices shown, 12 images]
[im 5/74  brain]
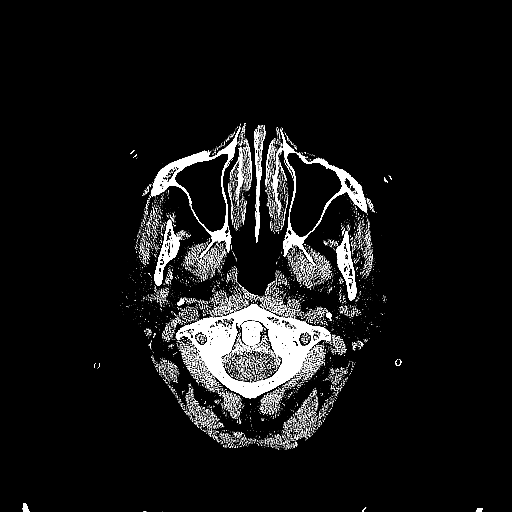
[im 5/74  bone]
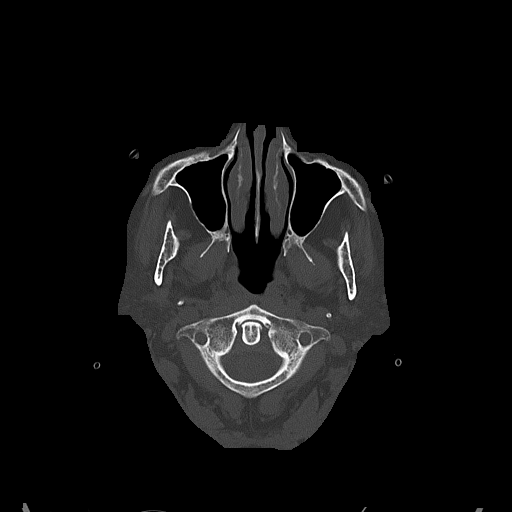
[im 13/74  brain]
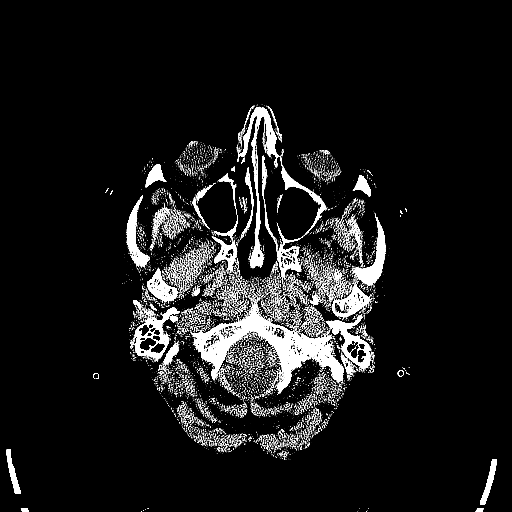
[im 21/74  brain]
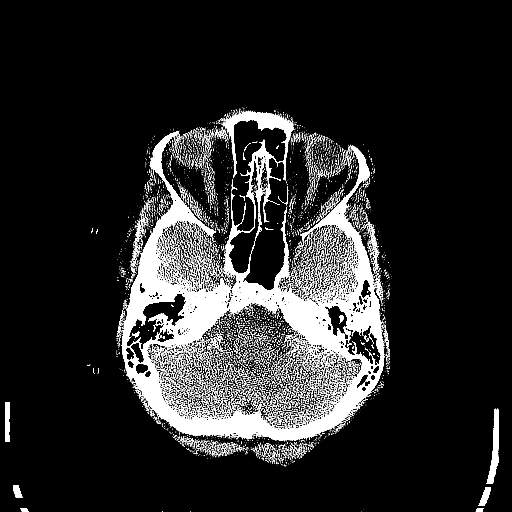
[im 29/74  brain]
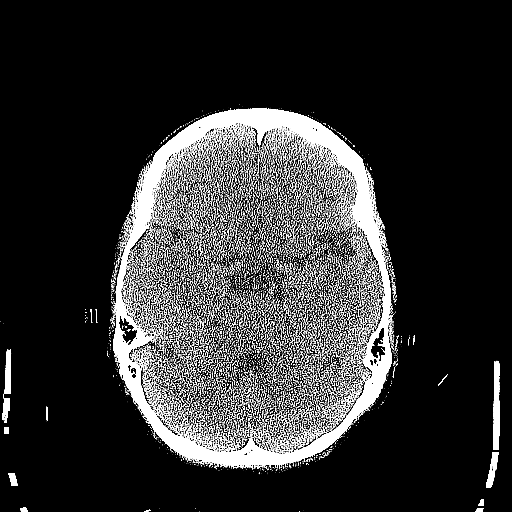
[im 37/74  brain]
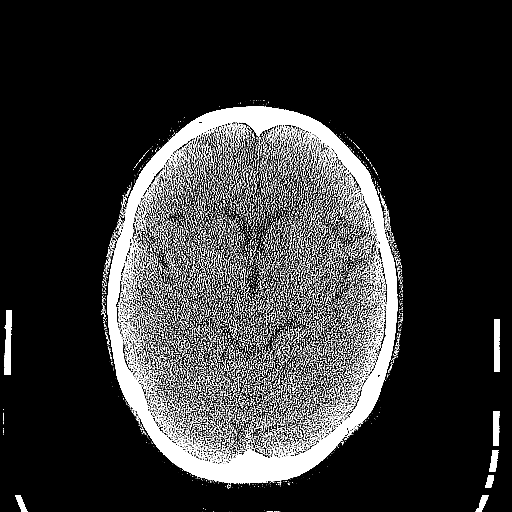
[im 37/74  bone]
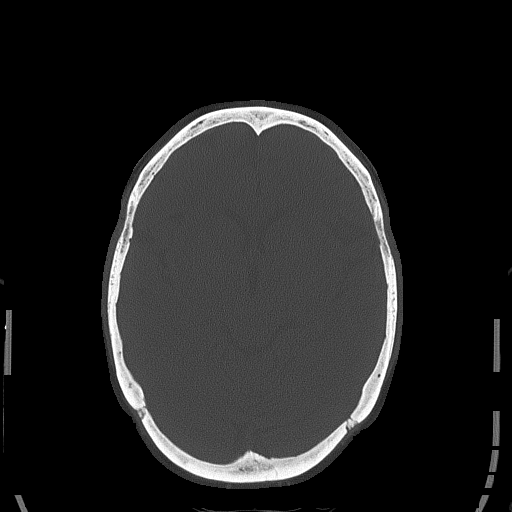
[im 45/74  brain]
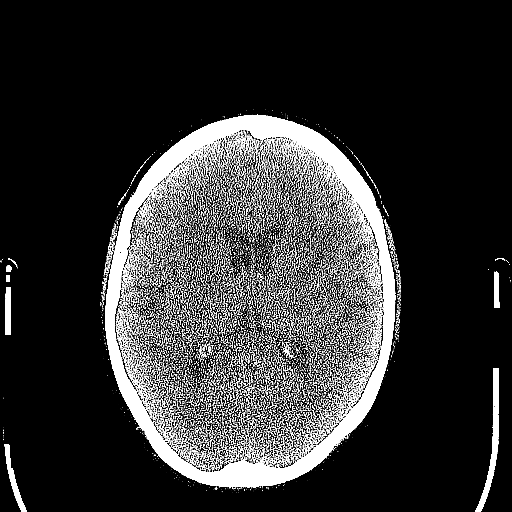
[im 53/74  brain]
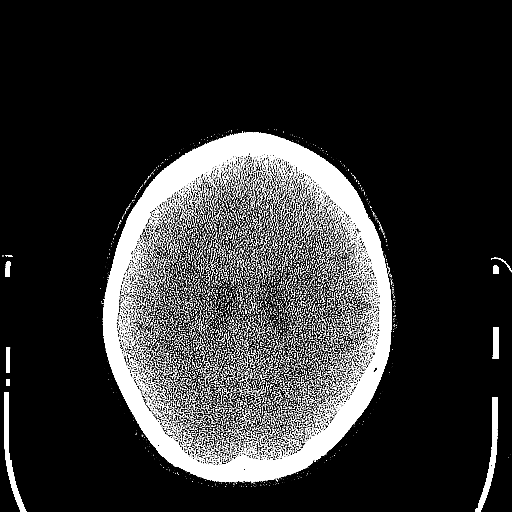
[im 61/74  brain]
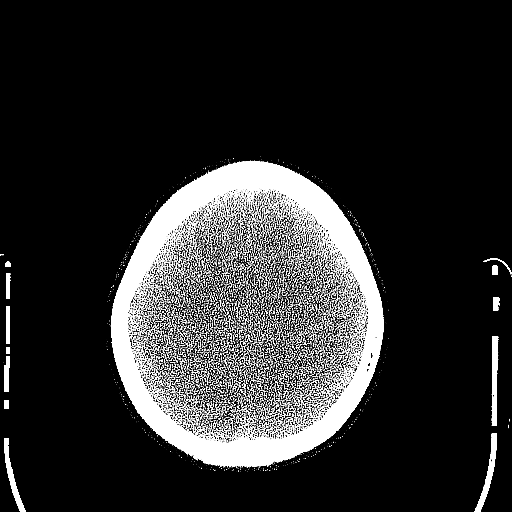
[im 69/74  brain]
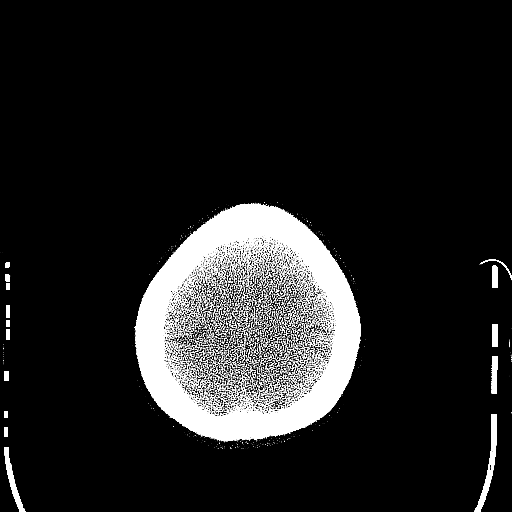
[im 69/74  bone]
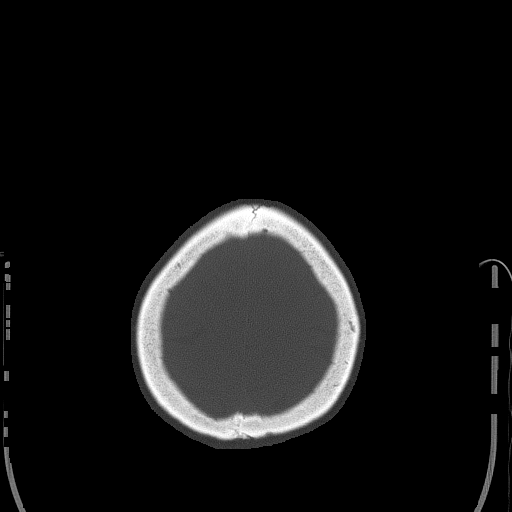

[Series 4: head 3.0 mpr · coronal · 0.27mm/px · 3 of 66 slices shown (1 of 2)]
[im 22/66  brain]
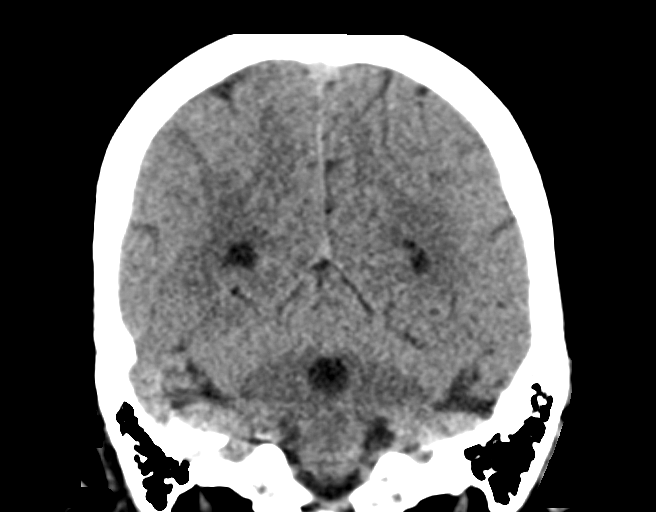
[im 33/66  brain]
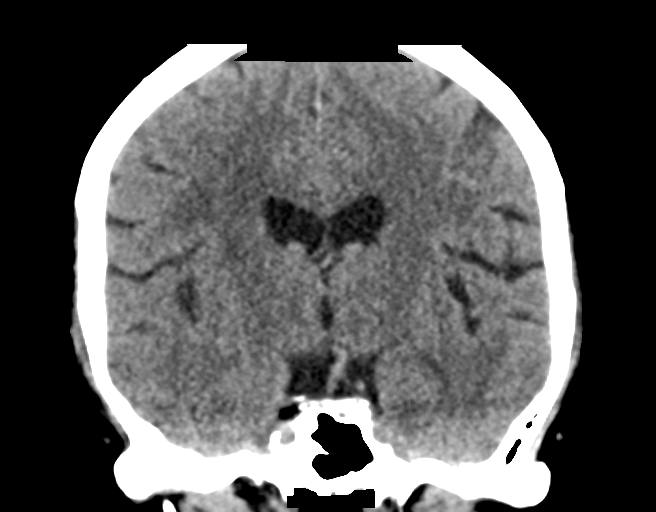
[im 44/66  brain]
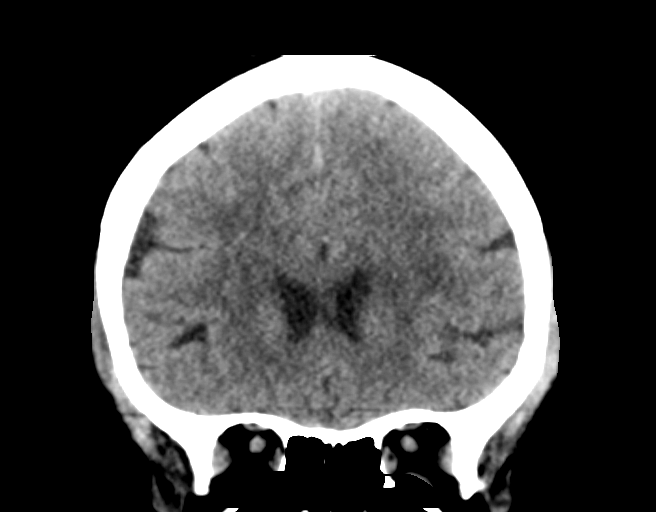

[Series 5: head 3.0 mpr · sagittal · 0.30mm/px · 2 of 64 slices shown (2 of 2)]
[im 22/64  brain]
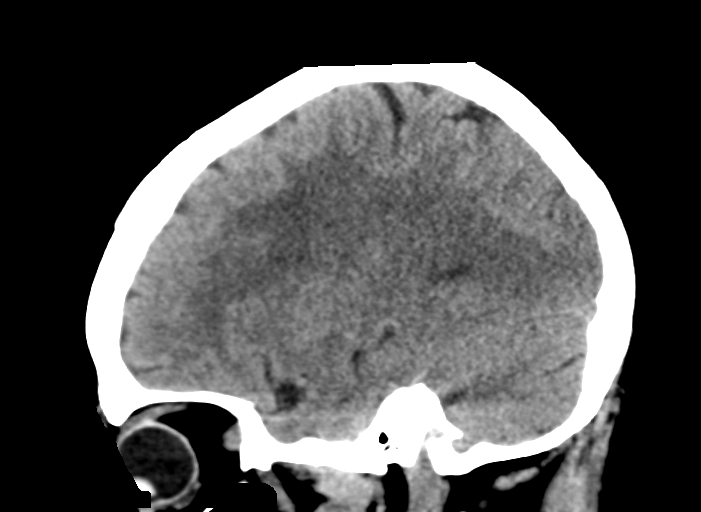
[im 43/64  brain]
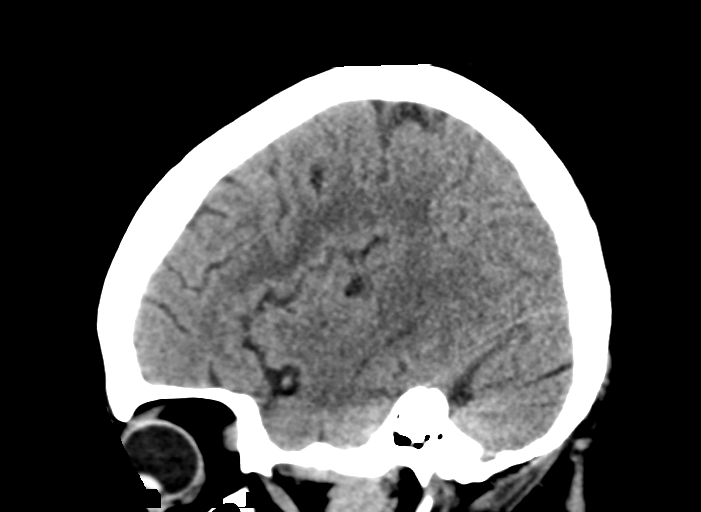

[14 of 47 positions shown; findings below may reference images not displayed]

FINDINGS: Age-related cerebral volume loss present. Patchy and confluent
hypodensity within the periventricular and deep white matter both
cerebral hemispheres most likely related chronic small vessel
ischemic disease. Probable small remote lacunar infarct within the
left basal ganglia.

No definite acute large vessel territory infarct. Gray-white matter
differentiation maintained. Deep gray nuclei relatively maintained.
Vasculature is fairly uniform in appearance without definite
hyperdense vessel. No acute intracranial hemorrhage. No mass lesion,
midline shift, or mass effect. No hydrocephalus. No extra-axial
fluid collection.

Scalp soft tissues within normal limits. No acute abnormality about
the globes and orbits.

Paranasal sinuses are clear.  No mastoid effusion.

Calvarium intact.
IMPRESSION: 1. No acute intracranial process identified.
2. Age-related cerebral atrophy with moderate chronic microvascular
ischemic disease.

Critical Value/emergent results were called by telephone at the time
of interpretation on 01/18/2016 at [DATE] to Dr. Jorje, who
verbally acknowledged these results.

## 2020-04-21 ENCOUNTER — Ambulatory Visit: Payer: Self-pay

## 2021-08-01 ENCOUNTER — Other Ambulatory Visit: Payer: Self-pay

## 2021-08-01 ENCOUNTER — Encounter (HOSPITAL_COMMUNITY): Payer: Self-pay | Admitting: Emergency Medicine

## 2021-08-01 ENCOUNTER — Emergency Department (HOSPITAL_COMMUNITY)
Admission: EM | Admit: 2021-08-01 | Discharge: 2021-08-01 | Disposition: A | Discharge status: Home / Self Care | Payer: Medicare Other | Attending: Emergency Medicine | Admitting: Emergency Medicine

## 2021-08-01 DIAGNOSIS — F419 Anxiety disorder, unspecified: Secondary | ICD-10-CM | POA: Insufficient documentation

## 2021-08-01 DIAGNOSIS — Z5900 Homelessness unspecified: Secondary | ICD-10-CM | POA: Diagnosis not present

## 2021-08-01 NOTE — ED Provider Notes (Signed)
[facility]-EMERGENCY DEPT Provider Note   CSN: [id number] Arrival date & time: 08/01/21  1201     History  No chief complaint on file.   Tracey Nixon is a 66 y.o. female.  66 yo F with a chief complaints feeling upset that she was kicked out of her sister's place earlier today.  She apparently was found on the side of the road complaining of anxiety.  Says that she feels better now and would prefer to go home.  She denies any other significant complaint.       Home Medications Prior to Admission medications   Medication Sig Start Date End Date Taking? Authorizing Provider  amLODipine (NORVASC) 10 MG tablet Take 1 tablet (10 mg total) by mouth daily. 01/19/16   Althia Forts, MD  methocarbamol (ROBAXIN) 500 MG tablet Take 1 tablet (500 mg total) by mouth 2 (two) times daily. 01/19/16   Althia Forts, MD  oxyCODONE-acetaminophen (PERCOCET/ROXICET) 5-325 MG tablet Take 1 tablet by mouth every 6 (six) hours as needed for severe pain. 01/24/16   Maretta Bees, MD      Allergies    Darvocet [propoxyphene n-acetaminophen] and Demerol [meperidine]    Review of Systems   Review of Systems  Physical Exam Updated Vital Signs BP (!) 151/64 (BP Location: Right Arm)    Pulse 84    Temp 98.2 F (36.8 C) (Oral)    Resp 20    SpO2 95%  Physical Exam Vitals and nursing note reviewed.  Constitutional:      General: She is not in acute distress.    Appearance: She is well-developed. She is not diaphoretic.  HENT:     Head: Normocephalic and atraumatic.     Mouth/Throat:     Comments: Adentulous Eyes:     Pupils: Pupils are equal, round, and reactive to light.  Cardiovascular:     Rate and Rhythm: Normal rate and regular rhythm.     Heart sounds: No murmur heard.   No friction rub. No gallop.  Pulmonary:     Effort: Pulmonary effort is normal.     Breath sounds: No wheezing or rales.  Abdominal:     General: There is no distension.     Palpations: Abdomen is  soft.     Tenderness: There is no abdominal tenderness.  Musculoskeletal:        General: No tenderness.     Cervical back: Normal range of motion and neck supple.  Skin:    General: Skin is warm and dry.  Neurological:     Mental Status: She is alert and oriented to person, place, and time.  Psychiatric:        Behavior: Behavior normal.    ED Results / Procedures / Treatments   Labs (all labs ordered are listed, but only abnormal results are displayed) Labs Reviewed - No data to display  EKG None  Radiology No results found.  Procedures Procedures    Medications Ordered in ED Medications - No data to display  ED Course/ Medical Decision Making/ A&P                           Medical Decision Making  Patient is a 66 y.o. female with a cc of being homeless and being upset about it.  She has no other complaints would like to go home at this time.  I do not feel holding her against her will and having a  psychiatric or medical work-up would benefit the patient.  We will discharge her home.  PCP follow-up.  Given a list of shelter resources. 1:08 PM:  I have discussed the diagnosis/risks/treatment options with the patient.  Evaluation and diagnostic testing in the emergency department does not suggest an emergent condition requiring admission or immediate intervention beyond what has been performed at this time.  They will follow up with  PCP. We also discussed returning to the ED immediately if new or worsening sx occur. We discussed the sx which are most concerning (e.g., sudden worsening pain, fever, inability to tolerate by mouth) that necessitate immediate return. Medications administered to the patient during their visit and any new prescriptions provided to the patient are listed below.  Medications given during this visit Medications - No data to display   The patient appears reasonably screen and/or stabilized for discharge and I doubt any other medical condition or  other [facility] requiring further screening, evaluation, or treatment in the ED at this time prior to discharge.          Final Clinical Impression(s) / ED Diagnoses Final diagnoses:  Anxiety  Homelessness    Rx / DC Orders ED Discharge Orders     None         Melene Plan, DO 08/01/21 1308

## 2021-08-01 NOTE — ED Triage Notes (Signed)
Pt BIBA Per EMS: Pt coming from side of road w/ c/o anxiety attack, hypertension- 200/100 BP. Pt sister kicked her put of hotel this morning & now pt is homeless  Nose bleed occurred en route.  All other vitals WDL

## 2022-04-04 ENCOUNTER — Encounter (HOSPITAL_COMMUNITY): Payer: Self-pay

## 2022-04-04 ENCOUNTER — Emergency Department (HOSPITAL_COMMUNITY): Payer: 59

## 2022-04-04 ENCOUNTER — Emergency Department (HOSPITAL_COMMUNITY)
Admission: EM | Admit: 2022-04-04 | Discharge: 2022-04-04 | Disposition: A | Discharge status: Home / Self Care | Payer: 59 | Attending: Emergency Medicine | Admitting: Emergency Medicine

## 2022-04-04 ENCOUNTER — Other Ambulatory Visit: Payer: Self-pay

## 2022-04-04 DIAGNOSIS — N183 Chronic kidney disease, stage 3 unspecified: Secondary | ICD-10-CM | POA: Insufficient documentation

## 2022-04-04 DIAGNOSIS — M542 Cervicalgia: Secondary | ICD-10-CM | POA: Diagnosis present

## 2022-04-04 DIAGNOSIS — Y92410 Unspecified street and highway as the place of occurrence of the external cause: Secondary | ICD-10-CM | POA: Insufficient documentation

## 2022-04-04 DIAGNOSIS — I129 Hypertensive chronic kidney disease with stage 1 through stage 4 chronic kidney disease, or unspecified chronic kidney disease: Secondary | ICD-10-CM | POA: Insufficient documentation

## 2022-04-04 DIAGNOSIS — Z79899 Other long term (current) drug therapy: Secondary | ICD-10-CM | POA: Insufficient documentation

## 2022-04-04 MED ORDER — ACETAMINOPHEN 500 MG PO TABS
1000.0000 mg | ORAL_TABLET | Freq: Once | ORAL | Status: AC
  Administered 2022-04-04: 1000 mg via ORAL
  Filled 2022-04-04: qty 2
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
## 2022-04-04 NOTE — ED Notes (Signed)
Pt went to CT Scan.

## 2022-04-04 NOTE — ED Notes (Signed)
And c-collar removed
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
## 2022-04-07 ENCOUNTER — Telehealth: Payer: Self-pay | Admitting: Licensed Clinical Social Worker

## 2022-04-07 NOTE — Patient Outreach (Signed)
  Care Coordination   04/07/2022 Name: Tracey Nixon MRN: [id number] DOB: [birth year]   Care Coordination Outreach Attempts:  An unsuccessful telephone outreach was attempted today to offer the patient information about available care coordination services as a benefit of their health plan. Female answered stating LCSW had the wrong number. Secondary number on file is not in service.  Follow Up Plan:  No further outreach attempts will be made at this time. We have been unable to contact the patient to offer or enroll patient in care coordination services  Encounter Outcome:  No Answer  Care Coordination Interventions Activated:  No   Care Coordination Interventions:  No, not indicated    Christa See, MSW, [email or web address] Phone [phone] 4:06 PM

## 2022-06-17 ENCOUNTER — Encounter (HOSPITAL_BASED_OUTPATIENT_CLINIC_OR_DEPARTMENT_OTHER): Payer: Self-pay

## 2022-06-17 ENCOUNTER — Other Ambulatory Visit: Payer: Self-pay

## 2022-06-17 ENCOUNTER — Emergency Department (HOSPITAL_BASED_OUTPATIENT_CLINIC_OR_DEPARTMENT_OTHER)
Admission: EM | Admit: 2022-06-17 | Discharge: 2022-06-17 | Disposition: A | Discharge status: Home / Self Care | Payer: 59 | Attending: Emergency Medicine | Admitting: Emergency Medicine

## 2022-06-17 DIAGNOSIS — Z7951 Long term (current) use of inhaled steroids: Secondary | ICD-10-CM | POA: Diagnosis not present

## 2022-06-17 DIAGNOSIS — U071 COVID-19: Secondary | ICD-10-CM | POA: Diagnosis not present

## 2022-06-17 DIAGNOSIS — Z79899 Other long term (current) drug therapy: Secondary | ICD-10-CM | POA: Insufficient documentation

## 2022-06-17 DIAGNOSIS — J449 Chronic obstructive pulmonary disease, unspecified: Secondary | ICD-10-CM | POA: Diagnosis not present

## 2022-06-17 DIAGNOSIS — I10 Essential (primary) hypertension: Secondary | ICD-10-CM | POA: Diagnosis not present

## 2022-06-17 DIAGNOSIS — M791 Myalgia, unspecified site: Secondary | ICD-10-CM | POA: Diagnosis present

## 2022-06-17 LAB — RESP PANEL BY RT-PCR (RSV, FLU A&B, COVID)  RVPGX2
Influenza A by PCR: NEGATIVE
Influenza B by PCR: NEGATIVE
Resp Syncytial Virus by PCR: NEGATIVE
SARS Coronavirus 2 by RT PCR: POSITIVE — AB

## 2022-06-17 MED ORDER — ALBUTEROL SULFATE HFA 108 (90 BASE) MCG/ACT IN AERS: 1.0000 | INHALATION_SPRAY | Freq: Four times a day (QID) | RESPIRATORY_TRACT | 0 refills | Status: DC | PRN

## 2022-06-17 NOTE — Discharge Instructions (Signed)
Please follow-up with your primary care doctor.  Make sure you are isolating from others, and if you develop severe shortness of breath please return to the ER.  You can take Tylenol and ibuprofen for your fever, and rest a lot and drink lots of fluids.

## 2022-06-17 NOTE — ED Provider Notes (Signed)
[facility] EMERGENCY DEPARTMENT Provider Note   CSN: [id number] Arrival date & time: 06/17/22  2041     History  Chief Complaint  Patient presents with   Generalized Body Aches    Tracey Nixon is a 66 y.o. female, history of hypertension, COPD, who presents to the ED secondary to shortness of breath and severe fatigue for the last 2 days.  She states she is just about felt down, and felt slightly warm to the touch.  She denies any nausea, vomiting, diarrhea, chest pain.    Home Medications Prior to Admission medications   Medication Sig Start Date End Date Taking? Authorizing Provider  albuterol (VENTOLIN HFA) 108 (90 Base) MCG/ACT inhaler Inhale 1-2 puffs into the lungs every 6 (six) hours as needed for wheezing or shortness of breath. 06/17/22  Yes Burnis Halling L, PA  amLODipine (NORVASC) 10 MG tablet Take 1 tablet (10 mg total) by mouth daily. 01/19/16   Althia Forts, MD  methocarbamol (ROBAXIN) 500 MG tablet Take 1 tablet (500 mg total) by mouth 2 (two) times daily. 01/19/16   Althia Forts, MD  oxyCODONE-acetaminophen (PERCOCET/ROXICET) 5-325 MG tablet Take 1 tablet by mouth every 6 (six) hours as needed for severe pain. 01/24/16   Maretta Bees, MD      Allergies    Darvocet [propoxyphene n-acetaminophen] and Demerol [meperidine]    Review of Systems   Review of Systems  Constitutional:  Positive for fatigue.  Respiratory:  Positive for shortness of breath.   Cardiovascular:  Negative for chest pain.  Gastrointestinal:  Negative for diarrhea, nausea and vomiting.    Physical Exam Updated Vital Signs BP (!) 152/79 (BP Location: Right Arm)   Pulse 74   Temp 98.5 F (36.9 C) (Oral)   Resp 20   Ht 5\' 3"  (1.6 m)   Wt 63 kg   SpO2 96%   BMI 24.62 kg/m  Physical Exam Vitals and nursing note reviewed.  Constitutional:      General: She is not in acute distress.    Appearance: She is well-developed.  HENT:     Head: Normocephalic and atraumatic.  Eyes:      Conjunctiva/sclera: Conjunctivae normal.  Cardiovascular:     Rate and Rhythm: Normal rate and regular rhythm.     Heart sounds: No murmur heard. Pulmonary:     Effort: Pulmonary effort is normal. No respiratory distress.     Breath sounds: Normal breath sounds.  Abdominal:     Palpations: Abdomen is soft.     Tenderness: There is no abdominal tenderness.  Musculoskeletal:        General: No swelling.     Cervical back: Neck supple.  Skin:    General: Skin is warm and dry.     Capillary Refill: Capillary refill takes less than 2 seconds.  Neurological:     Mental Status: She is alert.  Psychiatric:        Mood and Affect: Mood normal.     ED Results / Procedures / Treatments   Labs (all labs ordered are listed, but only abnormal results are displayed) Labs Reviewed  RESP PANEL BY RT-PCR (RSV, FLU A&B, COVID)  RVPGX2 - Abnormal; Notable for the following components:      Result Value   SARS Coronavirus 2 by RT PCR POSITIVE (*)    All other components within normal limits    EKG None  Radiology No results found.  Procedures Procedures    Medications Ordered in ED Medications -  No data to display  ED Course/ Medical Decision Making/ A&P                           Medical Decision Making Patient is a 66 year old female, history of COPD, here for shortness of breath and fatigue for the last 2 days, she has lungs that are clear to auscultation, and overall looks well.  Just fatigue.  We will obtain viral sample, for further evaluation.  Amount and/or Complexity of Data Reviewed Labs:     Details: COVID-positive Discussion of management or test interpretation with external provider(s): Patient well-appearing, complaint of shortness of breath, respiratory status within normal limits, no wheezing, cough on exam.  She request an albuterol inhaler, for personal use, albuterol inhaler sent to the pharmacy.  We discussed return precautions and to follow-up with primary  care doctor.   Final Clinical Impression(s) / ED Diagnoses Final diagnoses:  COVID    Rx / DC Orders ED Discharge Orders          Ordered    albuterol (VENTOLIN HFA) 108 (90 Base) MCG/ACT inhaler  Every 6 hours PRN        06/17/22 2246              Anouk Critzer, [place]ley [place], PA 06/17/22 2248    Pricilla Loveless, MD 06/19/22 1005

## 2022-06-17 NOTE — ED Triage Notes (Signed)
Pt with body aches, fever, and "I just don't feel good" x2 days.

## 2022-06-27 ENCOUNTER — Ambulatory Visit: Payer: Self-pay

## 2022-06-27 NOTE — Telephone Encounter (Signed)
  Chief Complaint: high BP 200/86 Wednesday; 186/? Yesterday and during call 204/92 Symptoms: no sx - Wednesday and Thursday was in bed with severe headache" Today pt denies any sx Frequency: at least since Wed Pertinent Negatives: Patient denies blurred vision, chest pain, difficulty breathing, headache or weakness Disposition: [] ED /[x] Urgent Care (no appt availability in office) / [] Appointment(In office/virtual)/ []  [facility]/ [] Home Care/ [] Refused Recommended Disposition /[] [facility]/ []  Follow-up with PCP Additional Notes: Pt supposed to go to [facility] per her Medicaid card, No appts until 09/22/22. Gave pt phone numbers to [facility] [place] [facility], [facility] and [facility]. Stated she will go to UC. Reason for Disposition  [7] Systolic BP  >= 322 OR Diastolic >= 025 AND [4] having NO cardiac or neurologic symptoms  Answer Assessment - Initial Assessment Questions 1. BLOOD PRESSURE: "What is the blood pressure?" "Did you take at least two measurements 5 minutes apart?"     200/86 2 days ago 186/? Yesterday  during call:  2. ONSET: "When did you take your blood pressure?"     204/92 3. HOW: "How did you take your blood pressure?" (e.g., automatic home BP monitor, visiting nurse)     Automatic Home BP 4. HISTORY: "Do you have a history of high blood pressure?"     N/a 5. MEDICINES: "Are you taking any medicines for blood pressure?" "Have you missed any doses recently?"     no 6. OTHER SYMPTOMS: "Do you have any symptoms?" (e.g., blurred vision, chest pain, difficulty breathing, headache, weakness)    Ok today but 2 days severe headache 7. PREGNANCY: "Is there any chance you are pregnant?" "When was your last menstrual period?"     N/a  Protocols used: Blood Pressure - High-A-AH

## 2022-07-02 ENCOUNTER — Ambulatory Visit (INDEPENDENT_AMBULATORY_CARE_PROVIDER_SITE_OTHER): Payer: 59 | Admitting: Student

## 2022-07-02 ENCOUNTER — Encounter: Payer: Self-pay | Admitting: Student

## 2022-07-02 VITALS — BP 176/88 | HR 79 | Wt 137.5 lb

## 2022-07-02 DIAGNOSIS — N183 Chronic kidney disease, stage 3 unspecified: Secondary | ICD-10-CM

## 2022-07-02 DIAGNOSIS — I10 Essential (primary) hypertension: Secondary | ICD-10-CM

## 2022-07-02 DIAGNOSIS — F17200 Nicotine dependence, unspecified, uncomplicated: Secondary | ICD-10-CM

## 2022-07-02 DIAGNOSIS — Z13228 Encounter for screening for other metabolic disorders: Secondary | ICD-10-CM

## 2022-07-02 DIAGNOSIS — Z720 Tobacco use: Secondary | ICD-10-CM

## 2022-07-02 DIAGNOSIS — Z1211 Encounter for screening for malignant neoplasm of colon: Secondary | ICD-10-CM

## 2022-07-02 MED ORDER — AMLODIPINE BESYLATE 5 MG PO TABS: 5.0000 mg | ORAL_TABLET | Freq: Every day | ORAL | 3 refills | Status: DC

## 2022-07-02 MED ORDER — ALBUTEROL SULFATE HFA 108 (90 BASE) MCG/ACT IN AERS: 1.0000 | INHALATION_SPRAY | Freq: Four times a day (QID) | RESPIRATORY_TRACT | 0 refills | Status: DC | PRN

## 2022-07-02 MED ORDER — VALSARTAN 40 MG PO TABS: 40.0000 mg | ORAL_TABLET | Freq: Every day | ORAL | 0 refills | Status: DC

## 2022-07-02 NOTE — Assessment & Plan Note (Signed)
Patient did not note that she had CKD.  I discussed this with her and that we will need to get labs for her. -CMP

## 2022-07-02 NOTE — Patient Instructions (Addendum)
It was great to see you! Thank you for allowing me to participate in your care!   Our plans for today:  -I have sent in referral for colonoscopy, low-dose CT for lung cancer screening -If sent in a new blood pressure medication called valsartan to take nightly in addition to your amlodipine 5 mg daily -I would like to see you in 2 weeks to make sure that we are catching up on all your care gaps -I have put your labs as future, we will get this next time or you may call the office for labs -I have refilled your albuterol  Take care and seek immediate care sooner if you develop any concerns.  [name], MD

## 2022-07-02 NOTE — Assessment & Plan Note (Addendum)
168/92 and on recheck was 176/88.  Patient says at home it goes up to 892J to 194 systolic.  Has been taking amlodipine 5 mg daily. -Continue amlodipine 5 mg daily -Valsartan 40 mg daily (wanted to add renal protection for her) -CBC, CMP, lipid panel, A1c future orders as patient does not want to get labs today

## 2022-07-02 NOTE — Progress Notes (Signed)
New Patient Office Visit  Subjective    Patient ID: Tracey Nixon, female    DOB: [birth year]  Age: 67 y.o. MRN: [id number]  CC:  Chief Complaint  Patient presents with   Establish Care   HTN Denies any headache, vision changes, shortness of breath, lower extremity swelling or chest pain.  She says that she currently takes 5 mg amlodipine.  She says that it is not doing anything for her and she takes her blood pressure at home which have ranged systolics 382N to 053Z.  HPI Tracey Nixon presents to establish care PMH-CKD 3, HTN (takes 5 mg amlodipine), ?asthma or COPD-on albuterol inhaler Taking amlodipine 5 mg daily (took 10 mg 1 day due to elevated BP at home).  Surgical-Hysterectomy Allergies-None (says that Darvocet and Demerol are allergies due to cardiac arrest?  Patient denies any history of cardiac arrest or heart attack-unsure what this is) Social-alcohol-never, tobacco-1 pack for 5 days- 40 years, drug use-cocaine-has not used in a while, never IVDU; husband passed away from stroke 5 years ago, currently living with a friend Family history-Mom-kidney disease, no heart attack or stroke, Uncle-brain cancer    Outpatient Encounter Medications as of 07/02/2022  Medication Sig   amLODipine (NORVASC) 5 MG tablet Take 1 tablet (5 mg total) by mouth at bedtime.   valsartan (DIOVAN) 40 MG tablet Take 1 tablet (40 mg total) by mouth daily.   albuterol (VENTOLIN HFA) 108 (90 Base) MCG/ACT inhaler Inhale 1-2 puffs into the lungs every 6 (six) hours as needed for wheezing or shortness of breath.   [DISCONTINUED] albuterol (VENTOLIN HFA) 108 (90 Base) MCG/ACT inhaler Inhale 1-2 puffs into the lungs every 6 (six) hours as needed for wheezing or shortness of breath.   [DISCONTINUED] amLODipine (NORVASC) 10 MG tablet Take 1 tablet (10 mg total) by mouth daily.   [DISCONTINUED] methocarbamol (ROBAXIN) 500 MG tablet Take 1 tablet (500 mg total) by mouth 2 (two) times daily.   [DISCONTINUED]  oxyCODONE-acetaminophen (PERCOCET/ROXICET) 5-325 MG tablet Take 1 tablet by mouth every 6 (six) hours as needed for severe pain.   No facility-administered encounter medications on file as of 07/02/2022.    Past Medical History:  Diagnosis Date   Hypertension     Past Surgical History:  Procedure Laterality Date   ABDOMINAL HYSTERECTOMY      Family History  Problem Relation Age of Onset   Kidney disease Mother     Social History   Socioeconomic History   Marital status: Married    Spouse name: Not on file   Number of children: Not on file   Years of education: Not on file   Highest education level: Not on file  Occupational History   Not on file  Tobacco Use   Smoking status: Every Day    Packs/day: 0.50    Years: 41.00    Total pack years: 20.50    Types: Cigarettes   Smokeless tobacco: Never  Vaping Use   Vaping Use: Never used  Substance and Sexual Activity   Alcohol use: No   Drug use: No    Comment: hx of cocaine use   Sexual activity: Not on file  Other Topics Concern   Not on file  Social History Narrative   Not on file   Social Determinants of Health   Financial Resource Strain: Not on file  Food Insecurity: Not on file  Transportation Needs: Not on file  Physical Activity: Not on file  Stress: Not on file  Social Connections: Not on file  Intimate Partner Violence: Not on file    ROS      Objective    BP (!) 176/88   Pulse 79   Wt 137 lb 8 oz (62.4 kg)   SpO2 96%   BMI 24.36 kg/m   Physical Exam Constitutional:      Appearance: Normal appearance.  HENT:     Head: Normocephalic and atraumatic.     Nose: Nose normal.     Mouth/Throat:     Mouth: Mucous membranes are moist.  Eyes:     Conjunctiva/sclera: Conjunctivae normal.  Cardiovascular:     Rate and Rhythm: Normal rate and regular rhythm.     Pulses: Normal pulses.     Heart sounds: Normal heart sounds.  Pulmonary:     Effort: Pulmonary effort is normal.     Breath  sounds: Normal breath sounds. No wheezing or rales.  Abdominal:     General: Abdomen is flat.     Palpations: Abdomen is soft.     Tenderness: There is no abdominal tenderness.  Musculoskeletal:        General: Normal range of motion.     Cervical back: Normal range of motion and neck supple.     Right lower leg: No edema.     Left lower leg: No edema.  Lymphadenopathy:     Cervical: No cervical adenopathy.  Skin:    General: Skin is warm and dry.     Capillary Refill: Capillary refill takes less than 2 seconds.  Neurological:     General: No focal deficit present.     Mental Status: She is alert.  Psychiatric:        Mood and Affect: Mood normal.       Assessment & Plan:   Problem List Items Addressed This Visit       Cardiovascular and Mediastinum   HTN (hypertension) - Primary    168/92 and on recheck was 176/88.  Patient says at home it goes up to 190s to 200 systolic.  Has been taking amlodipine 5 mg daily. -Continue amlodipine 5 mg daily -Valsartan 40 mg daily (wanted to add renal protection for her) -CBC, CMP, lipid panel, A1c future orders as patient does not want to get labs today      Relevant Medications   amLODipine (NORVASC) 5 MG tablet   valsartan (DIOVAN) 40 MG tablet   Other Relevant Orders   CBC   Comprehensive metabolic panel   Lipid Panel   Hemoglobin A1c     Genitourinary   CKD (chronic kidney disease) stage 3, GFR 30-59 ml/min (HCC)    Patient did not note that she had CKD.  I discussed this with her and that we will need to get labs for her. -CMP        Other   Tobacco abuse    Currently still smoking says that she smokes 1 pack every 5 days. -Low-dose CT lung cancer screening -Encourage cessation      Other Visit Diagnoses     Smoker       Relevant Orders   CT CHEST LUNG CA SCREEN LOW DOSE W/O CM   Screening for colon cancer       Relevant Orders   Ambulatory referral to Gastroenterology   Encounter for screening for other  metabolic disorders       Relevant Orders   Hepatitis C antibody (reflex, frozen specimen)      2 weeks follow  up for continued care gap coverage and to see how blood pressure is as well as labs  No follow-ups on file.   [name], MD

## 2022-07-02 NOTE — Assessment & Plan Note (Signed)
Currently still smoking says that she smokes 1 pack every 5 days. -Low-dose CT lung cancer screening -Encourage cessation

## 2022-07-14 ENCOUNTER — Ambulatory Visit (HOSPITAL_COMMUNITY): Payer: 59 | Attending: Family Medicine

## 2022-07-16 ENCOUNTER — Ambulatory Visit: Payer: 59 | Admitting: Student

## 2022-07-16 NOTE — Progress Notes (Deleted)
    SUBJECTIVE:   CHIEF COMPLAINT / HPI:   ***  PERTINENT  PMH / PSH: ***  OBJECTIVE:   There were no vitals taken for this visit.  ***  ASSESSMENT/PLAN:   No problem-specific Assessment & Plan notes found for this encounter.     Rastus Borton, MD [facility]  

## 2022-07-28 NOTE — Progress Notes (Deleted)
    SUBJECTIVE:   CHIEF COMPLAINT / HPI:   Hypertension: Patient is a 67 y.o. female who present today for follow up of hypertension.   Patient endorses {rwdmsmartlistproblems:24882}  Home medications include: amlodipine 5 mg daily, valsartan 40 mg daily Patient endorses taking these medications as prescribed.*** Denies any headache, vision changes, shortness of breath, lower extremity swelling or chest pain   Most recent creatinine trend:  Lab Results  Component Value Date   CREATININE 1.24 (H) 01/24/2016   CREATININE 1.23 (H) 01/19/2016   CREATININE 1.40 (H) 01/18/2016   Patient {rwdoesdoesnot:24881} check blood pressure at home.  Patient {HAS HAS ZOX:09604} had a BMP in the past 1 year.  PERTINENT  PMH / PSH: ***  OBJECTIVE:   There were no vitals taken for this visit.  ***  ASSESSMENT/PLAN:   No problem-specific Assessment & Plan notes found for this encounter.     [name], MD [place]

## 2022-07-29 ENCOUNTER — Other Ambulatory Visit: Payer: Self-pay | Admitting: Student

## 2022-07-29 ENCOUNTER — Ambulatory Visit: Payer: 59 | Admitting: Student

## 2022-07-29 ENCOUNTER — Other Ambulatory Visit: Payer: Self-pay

## 2022-07-29 DIAGNOSIS — I10 Essential (primary) hypertension: Secondary | ICD-10-CM

## 2022-07-29 MED ORDER — VALSARTAN 40 MG PO TABS: 40.0000 mg | ORAL_TABLET | Freq: Every day | ORAL | 0 refills | Status: DC

## 2022-08-18 ENCOUNTER — Ambulatory Visit (INDEPENDENT_AMBULATORY_CARE_PROVIDER_SITE_OTHER): Payer: 59 | Admitting: Student

## 2022-08-18 ENCOUNTER — Encounter: Payer: Self-pay | Admitting: Student

## 2022-08-18 DIAGNOSIS — I10 Essential (primary) hypertension: Secondary | ICD-10-CM

## 2022-08-18 MED ORDER — ALBUTEROL SULFATE HFA 108 (90 BASE) MCG/ACT IN AERS: 1.0000 | INHALATION_SPRAY | Freq: Four times a day (QID) | RESPIRATORY_TRACT | 0 refills | Status: DC | PRN

## 2022-08-18 MED ORDER — VALSARTAN 80 MG PO TABS: 80.0000 mg | ORAL_TABLET | Freq: Every day | ORAL | 0 refills | Status: DC

## 2022-08-18 NOTE — Progress Notes (Signed)
    SUBJECTIVE:   CHIEF COMPLAINT / HPIGK:[id number] ask for patient Hypertension: Patient is a 67 y.o. female who present today for follow up of hypertension.   Patient endorses  high BP at home  Home medications include: valsartan 40 mg daily Patient endorses taking these medications as prescribed. Denies any headache, vision changes, shortness of breath, lower extremity swelling or chest pain   Most recent creatinine trend:  Lab Results  Component Value Date   CREATININE 1.24 (H) 01/24/2016   CREATININE 1.23 (H) 01/19/2016   CREATININE 1.40 (H) 01/18/2016   Patient does check blood pressure at home 170s-180s two times elevated. Mostly in the 160s-170s  Patient has not had a BMP in the past 1 year.  Declines wanting smoking cessation strategies today  PERTINENT  PMH / PSH: smoker  OBJECTIVE:   BP (!) 188/76   Pulse 90   Wt 138 lb 6.4 oz (62.8 kg)   SpO2 96%   BMI 24.52 kg/m   General: Well appearing, NAD, awake, alert, responsive to questions Head: Normocephalic atraumatic CV: Regular rate and rhythm no murmurs rubs or gallops Respiratory: Coarse breath sounds, chest rises symmetrically,  no increased work of breathing Extremities: Moves upper and lower extremities freely, no edema in LE  ASSESSMENT/PLAN:   HTN (hypertension) 160/80 and on recheck was 188/76.  Has elevated blood pressures at home as well.  We discussed smoking cessation in depth.  She is adherent to her valsartan 40 mg daily. -Increase valsartan to 80 mg daily -Discussed that it is very important for her to get labs drawn given she has history of CKD 3 and I need to make sure her electrolytes and kidney function are okay while on this medication-patient will set up lab appointment to check this for next week -Follow-up in 1 month   Multiple care gaps, needs frequent visits to fill these  [name], MD [place]

## 2022-08-18 NOTE — Assessment & Plan Note (Addendum)
160/80 and on recheck was 188/76.  Has elevated blood pressures at home as well.  We discussed smoking cessation in depth.  She is adherent to her valsartan 40 mg daily. -Increase valsartan to 80 mg daily -Discussed that it is very important for her to get labs drawn given she has history of CKD 3 and I need to make sure her electrolytes and kidney function are okay while on this medication-patient will set up lab appointment to check this for next week -Follow-up in 1 month

## 2022-08-18 NOTE — Patient Instructions (Addendum)
It was great to see you! Thank you for allowing me to participate in your care!   Our plans for today:   -[facility]. Call for colonoscopy [facility] Address: [address], [place], [place] [address] Phone: [phone] - Please call to schedule your lung cancer screening CT -  We will increase your valsartan to 80 mg daily - It is very important to get your labs > they are ordered already-please stop at front desk to schedule a lab appointment  Take care and seek immediate care sooner if you develop any concerns.  [name], MD

## 2022-08-29 ENCOUNTER — Other Ambulatory Visit: Payer: 59

## 2022-08-29 DIAGNOSIS — Z13228 Encounter for screening for other metabolic disorders: Secondary | ICD-10-CM

## 2022-08-29 DIAGNOSIS — I10 Essential (primary) hypertension: Secondary | ICD-10-CM

## 2022-08-30 LAB — LIPID PANEL
Chol/HDL Ratio: 3 ratio (ref 0.0–4.4)
Cholesterol, Total: 177 mg/dL (ref 100–199)
HDL: 59 mg/dL (ref >39)
LDL Chol Calc (NIH): 105 mg/dL — ABNORMAL HIGH (ref 0–99)
Triglycerides: 71 mg/dL (ref 0–149)
VLDL Cholesterol Cal: 13 mg/dL (ref 5–40)

## 2022-08-30 LAB — CBC
Hematocrit: 35.7 % (ref 34.0–46.6)
Hemoglobin: 11.8 g/dL (ref 11.1–15.9)
MCH: 29.6 pg (ref 26.6–33.0)
MCHC: 33.1 g/dL (ref 31.5–35.7)
MCV: 90 fL (ref 79–97)
Platelets: 400 10*3/uL (ref 150–450)
RBC: 3.98 x10E6/uL (ref 3.77–5.28)
RDW: 15.2 % (ref 11.7–15.4)
WBC: 9.1 10*3/uL (ref 3.4–10.8)

## 2022-08-30 LAB — COMPREHENSIVE METABOLIC PANEL
ALT: 9 IU/L (ref 0–32)
AST: 16 IU/L (ref 0–40)
Albumin/Globulin Ratio: 1.8 (ref 1.2–2.2)
Albumin: 4.1 g/dL (ref 3.9–4.9)
Alkaline Phosphatase: 85 IU/L (ref 44–121)
BUN/Creatinine Ratio: 23 (ref 12–28)
BUN: 27 mg/dL (ref 8–27)
Bilirubin Total: 0.4 mg/dL (ref 0.0–1.2)
CO2: 21 mmol/L (ref 20–29)
Calcium: 9.1 mg/dL (ref 8.7–10.3)
Chloride: 108 mmol/L — ABNORMAL HIGH (ref 96–106)
Creatinine, Ser: 1.19 mg/dL — ABNORMAL HIGH (ref 0.57–1.00)
Globulin, Total: 2.3 g/dL (ref 1.5–4.5)
Glucose: 88 mg/dL (ref 70–99)
Potassium: 5.1 mmol/L (ref 3.5–5.2)
Sodium: 143 mmol/L (ref 134–144)
Total Protein: 6.4 g/dL (ref 6.0–8.5)
eGFR: 50 mL/min/{1.73_m2} — ABNORMAL LOW (ref >59)

## 2022-08-30 LAB — HCV INTERPRETATION

## 2022-08-30 LAB — HEMOGLOBIN A1C
Est. average glucose Bld gHb Est-mCnc: 114 mg/dL
Hgb A1c MFr Bld: 5.6 % (ref 4.8–5.6)

## 2022-08-30 LAB — HCV AB W REFLEX TO QUANT PCR: HCV Ab: NONREACTIVE

## 2022-09-01 ENCOUNTER — Telehealth: Payer: Self-pay | Admitting: Student

## 2022-09-01 ENCOUNTER — Telehealth: Payer: Self-pay

## 2022-09-01 ENCOUNTER — Ambulatory Visit (INDEPENDENT_AMBULATORY_CARE_PROVIDER_SITE_OTHER): Payer: 59 | Admitting: Family Medicine

## 2022-09-01 VITALS — Ht 63.0 in

## 2022-09-01 DIAGNOSIS — Z91199 Patient's noncompliance with other medical treatment and regimen due to unspecified reason: Secondary | ICD-10-CM

## 2022-09-01 NOTE — Progress Notes (Signed)
Pt did not show to appointment.

## 2022-09-01 NOTE — Telephone Encounter (Signed)
-----   Message from [name], MD sent at 09/01/2022 10:10 AM EDT ----- Could you please have patient scheduled to see me to discuss lab results? Thank you!  Tracey Nixon

## 2022-09-01 NOTE — Telephone Encounter (Signed)
Called number provided x2, no answer. Let VM to call clinic back to discuss lab results. Potassium is elevated to 5.1 - may need this rechecked given she is on ARB. Kidney function similar. ASCVD risk elevated-would recommend statin medication. Mild leukocytosis-we can recheck at later date. A1c negative for diabetes or prediabetes. Hep Ca negative. If she calls back please schedule appointment with me to discuss this further The 10-year ASCVD risk score (Arnett DK, et al., 2019) is: 31.5%   Values used to calculate the score:     Age: 60 years     Sex: Female     Is Non-Hispanic African American: No     Diabetic: No     Tobacco smoker: Yes     Systolic Blood Pressure: 0000000 mmHg     Is BP treated: Yes     HDL Cholesterol: 59 mg/dL     Total Cholesterol: 177 mg/dL

## 2022-09-01 NOTE — Addendum Note (Signed)
Addended by: [name] on: 09/01/2022 10:07 AM   Modules accepted: Orders

## 2022-09-01 NOTE — Telephone Encounter (Signed)
Attempted to reach patient through patients number, sister's and friends number that is on file. The patient and sister number is a non working number, and the friends number just rang. Will try to reach out again. Salvatore Marvel, CMA

## 2022-09-09 ENCOUNTER — Other Ambulatory Visit: Payer: Self-pay | Admitting: Student

## 2022-09-13 ENCOUNTER — Other Ambulatory Visit: Payer: Self-pay | Admitting: Student

## 2022-09-13 DIAGNOSIS — I10 Essential (primary) hypertension: Secondary | ICD-10-CM

## 2022-09-25 ENCOUNTER — Encounter: Payer: Self-pay | Admitting: Internal Medicine

## 2022-09-27 ENCOUNTER — Other Ambulatory Visit: Payer: Self-pay | Admitting: Student

## 2022-10-03 NOTE — Progress Notes (Unsigned)
    SUBJECTIVE:   CHIEF COMPLAINT / HPI:   Has a phone number until the end of this month [phone] which I have added to the epic chart-I discussed with her it has been very difficult to get in contact with her and she says that she is trying to get it working for the next month  Hypertension: Patient is a 67 y.o. female who present today for follow up of hypertension.   Patient endorses  been out of her medication for 4 days  Home medications include: valsartan 80 mg  (BMP with K of 5.1 after increase in valsartan-needs recheck today) Patient endorses taking these medications as prescribed.  Most recent creatinine trend:  Lab Results  Component Value Date   CREATININE 1.19 (H) 08/29/2022   CREATININE 1.24 (H) 01/24/2016   CREATININE 1.23 (H) 01/19/2016   Poor social situation Patient is needing a lot of help with her housing-lives with a roommate and has a lot of difficulty with her.  The roommate expects a lot of work from her and she is unable to do this.  She would like to get some help in terms of finances/housing and also has significant anxiety which she would like help with as well.  Anxiety She says that she has been having significant anxiety mostly related to her situation/housing.  She says previously she has been on Klonopin and Xanax and was wondering if this could be restarted.  She denies ever trying an SSRI.  She denies any sleepless.'s.  She says that she has had periods of time where she is had a significant issue with gambling/other risky behaviors.  Per chart review has had a history of substance use issues.  No known history of bipolar disorder.  Hyperlipidemia LDL 105, The 10-year ASCVD risk score (Arnett DK, et al., 2019) is: 31.5% - recommend statin -- unable to discuss today will reassess at next visit  PERTINENT  PMH / PSH:   OBJECTIVE:   BP (!) 174/84   Pulse 91   Temp 98.6 F (37 C)   Ht 5\' 3"  (1.6 m)   Wt 131 lb 3.2 oz (59.5 kg)   SpO2 98%    BMI 23.24 kg/m   General: Well appearing, NAD, awake, alert, responsive to questions Head: Normocephalic atraumatic Respiratory: chest rises symmetrically,  no increased work of breathing  ASSESSMENT/PLAN:   HTN (hypertension) Uncontrolled today.  On recheck was 174/84.  Has been out of her valsartan for past 4 days however anticipate that her blood pressure can handle increased blood pressure control. -Continue valsartan 80 mg daily -Start amlodipine 5 mg daily -BMP-if potassium remains in the 5s I would recommend decreasing valsartan dosage -BP recheck in 2 weeks  Housing insecurity Patient having multiple social issues today.  Would like to discuss with social worker further. -CCM referral placed  Anxiety Patient does seem to be very anxious mostly l related to her social situation.  I am concerned about starting a benzo in this patient and I discussed with her the risk of this.  She has never been on an SSRI but she has had some periods of time where she has had elevated moods/risky behaviors.  I would like for psychiatry to assess patient and give their formal recommendations. - psychiatry referral  Pure hypercholesterolemia I will need to discuss at future visit that statin is recommended due to ASCVD risk score.  Was elevated at 31.5%.   Levin Erp, MD [facility]

## 2022-10-06 ENCOUNTER — Other Ambulatory Visit: Payer: Self-pay | Admitting: Student

## 2022-10-06 ENCOUNTER — Ambulatory Visit (INDEPENDENT_AMBULATORY_CARE_PROVIDER_SITE_OTHER): Payer: 59 | Admitting: Student

## 2022-10-06 ENCOUNTER — Encounter: Payer: Self-pay | Admitting: Student

## 2022-10-06 VITALS — BP 174/84 | HR 91 | Temp 98.6°F | Ht 63.0 in | Wt 131.2 lb

## 2022-10-06 DIAGNOSIS — I10 Essential (primary) hypertension: Secondary | ICD-10-CM

## 2022-10-06 DIAGNOSIS — F419 Anxiety disorder, unspecified: Secondary | ICD-10-CM

## 2022-10-06 DIAGNOSIS — Z59819 Housing instability, housed unspecified: Secondary | ICD-10-CM | POA: Diagnosis not present

## 2022-10-06 DIAGNOSIS — E7800 Pure hypercholesterolemia, unspecified: Secondary | ICD-10-CM | POA: Diagnosis not present

## 2022-10-06 MED ORDER — AMLODIPINE BESYLATE 5 MG PO TABS: 5.0000 mg | ORAL_TABLET | Freq: Every day | ORAL | 0 refills | Status: DC

## 2022-10-06 NOTE — Assessment & Plan Note (Signed)
Uncontrolled today.  On recheck was 174/84.  Has been out of her valsartan for past 4 days however anticipate that her blood pressure can handle increased blood pressure control. -Continue valsartan 80 mg daily -Start amlodipine 5 mg daily -BMP-if potassium remains in the 5s I would recommend decreasing valsartan dosage -BP recheck in 2 weeks

## 2022-10-06 NOTE — Assessment & Plan Note (Signed)
I will need to discuss at future visit that statin is recommended due to ASCVD risk score.  Was elevated at 31.5%.

## 2022-10-06 NOTE — Assessment & Plan Note (Signed)
Patient does seem to be very anxious mostly l related to her social situation.  I am concerned about starting a benzo in this patient and I discussed with her the risk of this.  She has never been on an SSRI but she has had some periods of time where she has had elevated moods/risky behaviors.  I would like for psychiatry to assess patient and give their formal recommendations. - psychiatry referral

## 2022-10-06 NOTE — Patient Instructions (Addendum)
It was great to see you! Thank you for allowing me to participate in your care!   Our plans for today:  -5/8 at 11 AM for phone call from GI office -Continue your valsartan 80 mg daily-your potassium is still elevated I may reduce this medication down -I will start amlodipine 5 mg daily as well for your blood pressure -Please schedule follow-up to see me in 2 weeks -I am putting in a referral for social work  Take care and seek immediate care sooner if you develop any concerns.  Levin Erp, MD

## 2022-10-06 NOTE — Assessment & Plan Note (Signed)
Patient having multiple social issues today.  Would like to discuss with social worker further. -CCM referral placed

## 2022-10-20 ENCOUNTER — Telehealth: Payer: Self-pay | Admitting: Student

## 2022-10-20 NOTE — Telephone Encounter (Signed)
Called patient to schedule Medicare Annual Wellness Visit (AWV). Left message for patient to call back and schedule Medicare Annual Wellness Visit (AWV).  Last date of AWV: AWVI eligible as of 10/22/2019  Please schedule an AWVI appointment at any time with [facility] VISIT.  If any questions, please contact me at [phone].    Thank you,  [facility] Support [facility] Direct dial  [phone]

## 2022-10-21 NOTE — Progress Notes (Unsigned)
    SUBJECTIVE:   CHIEF COMPLAINT / HPI:   Hypertension: Patient is a 67 y.o. female who present today for follow up of hypertension.   Patient endorses {rwdmsmartlistproblems:24882}  Home medications include: valsartan 80 mg, amlodipine 5 mg daily Patient endorses taking these medications as prescribed.*** Denies any headache, vision changes, shortness of breath, lower extremity swelling or chest pain   Most recent creatinine trend:  Lab Results  Component Value Date   CREATININE 1.19 (H) 08/29/2022   CREATININE 1.24 (H) 01/24/2016   CREATININE 1.23 (H) 01/19/2016   Patient {rwdoesdoesnot:24881} check blood pressure at home.  Patient {HAS HAS RUE:45409} had a BMP in the past 1 year.  PERTINENT  PMH / PSH: ***  OBJECTIVE:   There were no vitals taken for this visit.  ***  ASSESSMENT/PLAN:   No problem-specific Assessment & Plan notes found for this encounter.     Levin Erp, MD [facility]

## 2022-10-22 ENCOUNTER — Ambulatory Visit (INDEPENDENT_AMBULATORY_CARE_PROVIDER_SITE_OTHER): Payer: 59 | Admitting: Student

## 2022-10-22 ENCOUNTER — Encounter: Payer: Self-pay | Admitting: Student

## 2022-10-22 VITALS — BP 175/63 | HR 73 | Ht 64.0 in | Wt 132.2 lb

## 2022-10-22 DIAGNOSIS — I10 Essential (primary) hypertension: Secondary | ICD-10-CM | POA: Diagnosis not present

## 2022-10-22 DIAGNOSIS — J302 Other seasonal allergic rhinitis: Secondary | ICD-10-CM | POA: Diagnosis not present

## 2022-10-22 MED ORDER — ALBUTEROL SULFATE HFA 108 (90 BASE) MCG/ACT IN AERS: INHALATION_SPRAY | RESPIRATORY_TRACT | 0 refills | Status: DC

## 2022-10-22 MED ORDER — FEXOFENADINE HCL 60 MG PO TABS: 60.0000 mg | ORAL_TABLET | Freq: Two times a day (BID) | ORAL | 0 refills | Status: AC

## 2022-10-22 MED ORDER — VALSARTAN 80 MG PO TABS: ORAL_TABLET | ORAL | 0 refills | Status: DC

## 2022-10-22 MED ORDER — AMLODIPINE BESYLATE 10 MG PO TABS: 10.0000 mg | ORAL_TABLET | Freq: Every day | ORAL | 0 refills | Status: DC

## 2022-10-22 NOTE — Assessment & Plan Note (Signed)
Initially 142/70 but on recheck was higher likely due to anxiety.  Regardless will increase her amlodipine to 10 mg daily and continue valsartan 80 mg daily. -BMP today to reassess potassium -2-week follow-up for BP check and PFTs with Dr. Raymondo Band

## 2022-10-22 NOTE — Patient Instructions (Addendum)
It was great to see you! Thank you for allowing me to participate in your care!   Our plans for today:  - [facility] appointment for PFTs - We're getting labs today -increase amlodipine to 10 mg daily - I sent in your allergy meds and albuterol  Take care and seek immediate care sooner if you develop any concerns.  Levin Erp, MD

## 2022-10-23 ENCOUNTER — Encounter: Payer: Self-pay | Admitting: Student

## 2022-10-23 LAB — BASIC METABOLIC PANEL
BUN/Creatinine Ratio: 16 (ref 12–28)
BUN: 20 mg/dL (ref 8–27)
CO2: 23 mmol/L (ref 20–29)
Calcium: 8.9 mg/dL (ref 8.7–10.3)
Chloride: 106 mmol/L (ref 96–106)
Creatinine, Ser: 1.24 mg/dL — ABNORMAL HIGH (ref 0.57–1.00)
Glucose: 98 mg/dL (ref 70–99)
Potassium: 4.7 mmol/L (ref 3.5–5.2)
Sodium: 144 mmol/L (ref 134–144)
eGFR: 47 mL/min/{1.73_m2} — ABNORMAL LOW (ref >59)

## 2022-10-23 MED ORDER — VALSARTAN 80 MG PO TABS: ORAL_TABLET | ORAL | 0 refills | Status: DC

## 2022-10-23 NOTE — Addendum Note (Signed)
Addended by: Levin Erp on: 10/23/2022 10:56 AM   Modules accepted: Orders

## 2022-10-29 ENCOUNTER — Ambulatory Visit (AMBULATORY_SURGERY_CENTER): Payer: 59 | Admitting: *Deleted

## 2022-10-29 VITALS — Ht 63.0 in | Wt 131.0 lb

## 2022-10-29 DIAGNOSIS — Z1211 Encounter for screening for malignant neoplasm of colon: Secondary | ICD-10-CM

## 2022-10-29 MED ORDER — NA SULFATE-K SULFATE-MG SULF 17.5-3.13-1.6 GM/177ML PO SOLN: 1.0000 | Freq: Once | ORAL | 0 refills | Status: AC

## 2022-10-29 NOTE — Progress Notes (Signed)

## 2022-11-05 ENCOUNTER — Telehealth: Payer: Self-pay

## 2022-11-05 NOTE — Telephone Encounter (Addendum)
Patient LVM on nurse line in regards to Amlodipine and Valsartan prescriptions.   She is requesting a refill.   Medication was sent to [facility] on 5/1.  Attempted to call her back to inform, however I was not able to get through.

## 2022-11-10 ENCOUNTER — Other Ambulatory Visit: Payer: Self-pay | Admitting: Student

## 2022-11-12 ENCOUNTER — Ambulatory Visit: Payer: Self-pay | Admitting: Student

## 2022-11-27 ENCOUNTER — Encounter: Payer: 59 | Admitting: Internal Medicine

## 2022-11-28 ENCOUNTER — Ambulatory Visit: Payer: 59

## 2022-12-16 ENCOUNTER — Other Ambulatory Visit: Payer: Self-pay | Admitting: Student

## 2023-01-05 ENCOUNTER — Other Ambulatory Visit: Payer: Self-pay | Admitting: Student

## 2023-01-05 DIAGNOSIS — I10 Essential (primary) hypertension: Secondary | ICD-10-CM

## 2023-01-15 ENCOUNTER — Telehealth: Payer: Self-pay | Admitting: Student

## 2023-01-15 DIAGNOSIS — I10 Essential (primary) hypertension: Secondary | ICD-10-CM

## 2023-01-15 NOTE — Telephone Encounter (Signed)
Patient calls nurse line requesting an early refill on blood pressure medication.   She reports her medication was in her purse, however she left her purse at [facility] yesterday. She reports she called them and no one turned it in.   Advised we can send in a refill, however unsure if insurance will cover.   Advised to work with pharmacy for [employer] and good rx coupons.   Will forward to PCP to send in.

## 2023-01-16 ENCOUNTER — Other Ambulatory Visit: Payer: Self-pay | Admitting: Student

## 2023-01-16 DIAGNOSIS — I10 Essential (primary) hypertension: Secondary | ICD-10-CM

## 2023-01-17 ENCOUNTER — Other Ambulatory Visit: Payer: Self-pay | Admitting: Student

## 2023-01-17 DIAGNOSIS — I10 Essential (primary) hypertension: Secondary | ICD-10-CM

## 2023-02-06 ENCOUNTER — Other Ambulatory Visit: Payer: Self-pay | Admitting: Student

## 2023-02-14 ENCOUNTER — Other Ambulatory Visit: Payer: Self-pay | Admitting: Student

## 2023-02-14 DIAGNOSIS — I10 Essential (primary) hypertension: Secondary | ICD-10-CM

## 2023-02-18 ENCOUNTER — Ambulatory Visit: Payer: 59

## 2023-03-03 ENCOUNTER — Other Ambulatory Visit: Payer: Self-pay | Admitting: Student

## 2023-03-03 DIAGNOSIS — I10 Essential (primary) hypertension: Secondary | ICD-10-CM

## 2023-03-15 ENCOUNTER — Other Ambulatory Visit: Payer: Self-pay | Admitting: Student

## 2023-03-15 DIAGNOSIS — I10 Essential (primary) hypertension: Secondary | ICD-10-CM

## 2023-03-31 ENCOUNTER — Ambulatory Visit: Payer: 59 | Admitting: Student

## 2023-04-16 ENCOUNTER — Other Ambulatory Visit: Payer: Self-pay | Admitting: Student

## 2023-04-16 DIAGNOSIS — I10 Essential (primary) hypertension: Secondary | ICD-10-CM

## 2023-05-18 ENCOUNTER — Other Ambulatory Visit: Payer: Self-pay | Admitting: Student

## 2023-05-18 DIAGNOSIS — I10 Essential (primary) hypertension: Secondary | ICD-10-CM

## 2023-05-26 ENCOUNTER — Other Ambulatory Visit: Payer: Self-pay | Admitting: Student

## 2023-06-12 ENCOUNTER — Other Ambulatory Visit: Payer: Self-pay | Admitting: Student

## 2023-06-12 DIAGNOSIS — I10 Essential (primary) hypertension: Secondary | ICD-10-CM

## 2023-07-18 ENCOUNTER — Other Ambulatory Visit: Payer: Self-pay

## 2023-07-18 ENCOUNTER — Emergency Department (HOSPITAL_COMMUNITY): Payer: 59

## 2023-07-18 ENCOUNTER — Emergency Department (HOSPITAL_COMMUNITY)
Admission: EM | Admit: 2023-07-18 | Discharge: 2023-07-19 | Disposition: A | Discharge status: Home / Self Care | Payer: 59 | Attending: Student | Admitting: Student

## 2023-07-18 DIAGNOSIS — Z20822 Contact with and (suspected) exposure to covid-19: Secondary | ICD-10-CM | POA: Diagnosis not present

## 2023-07-18 DIAGNOSIS — F17210 Nicotine dependence, cigarettes, uncomplicated: Secondary | ICD-10-CM | POA: Diagnosis not present

## 2023-07-18 DIAGNOSIS — R300 Dysuria: Secondary | ICD-10-CM | POA: Diagnosis present

## 2023-07-18 DIAGNOSIS — F1490 Cocaine use, unspecified, uncomplicated: Secondary | ICD-10-CM | POA: Diagnosis not present

## 2023-07-18 DIAGNOSIS — N3001 Acute cystitis with hematuria: Secondary | ICD-10-CM | POA: Insufficient documentation

## 2023-07-18 DIAGNOSIS — R509 Fever, unspecified: Secondary | ICD-10-CM | POA: Diagnosis present

## 2023-07-18 DIAGNOSIS — N183 Chronic kidney disease, stage 3 unspecified: Secondary | ICD-10-CM | POA: Diagnosis not present

## 2023-07-18 DIAGNOSIS — Z79899 Other long term (current) drug therapy: Secondary | ICD-10-CM | POA: Insufficient documentation

## 2023-07-18 DIAGNOSIS — I129 Hypertensive chronic kidney disease with stage 1 through stage 4 chronic kidney disease, or unspecified chronic kidney disease: Secondary | ICD-10-CM | POA: Diagnosis not present

## 2023-07-18 LAB — RESP PANEL BY RT-PCR (RSV, FLU A&B, COVID)  RVPGX2
Influenza A by PCR: NEGATIVE
Influenza B by PCR: NEGATIVE
Resp Syncytial Virus by PCR: NEGATIVE
SARS Coronavirus 2 by RT PCR: NEGATIVE

## 2023-07-18 MED ORDER — ACETAMINOPHEN 500 MG PO TABS
1000.0000 mg | ORAL_TABLET | Freq: Once | ORAL | Status: AC
  Administered 2023-07-19: 1000 mg via ORAL
  Filled 2023-07-18: qty 2

## 2023-07-18 NOTE — ED Triage Notes (Signed)
Patient to ED by POV with c/o flu like symptoms. Per patient she is having productive cough producing thick white phlegm, N/V. She states symptoms started last night and she gradually just have no been feeling well.

## 2023-07-19 DIAGNOSIS — N3001 Acute cystitis with hematuria: Secondary | ICD-10-CM | POA: Diagnosis not present

## 2023-07-19 LAB — COMPREHENSIVE METABOLIC PANEL
ALT: 19 U/L (ref 0–44)
AST: 20 U/L (ref 15–41)
Albumin: 3.6 g/dL (ref 3.5–5.0)
Alkaline Phosphatase: 63 U/L (ref 38–126)
Anion gap: 12 (ref 5–15)
BUN: 22 mg/dL (ref 8–23)
CO2: 21 mmol/L — ABNORMAL LOW (ref 22–32)
Calcium: 8.5 mg/dL — ABNORMAL LOW (ref 8.9–10.3)
Chloride: 105 mmol/L (ref 98–111)
Creatinine, Ser: 1.03 mg/dL — ABNORMAL HIGH (ref 0.44–1.00)
GFR, Estimated: 59 mL/min — ABNORMAL LOW (ref >60)
Glucose, Bld: 107 mg/dL — ABNORMAL HIGH (ref 70–99)
Potassium: 3.8 mmol/L (ref 3.5–5.1)
Sodium: 138 mmol/L (ref 135–145)
Total Bilirubin: 1.1 mg/dL (ref 0.0–1.2)
Total Protein: 7 g/dL (ref 6.5–8.1)

## 2023-07-19 LAB — URINALYSIS, ROUTINE W REFLEX MICROSCOPIC
Bilirubin Urine: NEGATIVE
Glucose, UA: NEGATIVE mg/dL
Ketones, ur: NEGATIVE mg/dL
Nitrite: POSITIVE — AB
Protein, ur: 100 mg/dL — AB
Specific Gravity, Urine: 1.018 (ref 1.005–1.030)
pH: 5 (ref 5.0–8.0)

## 2023-07-19 LAB — RAPID URINE DRUG SCREEN, HOSP PERFORMED
Amphetamines: NOT DETECTED
Barbiturates: NOT DETECTED
Benzodiazepines: NOT DETECTED
Cocaine: POSITIVE — AB
Opiates: NOT DETECTED
Tetrahydrocannabinol: NOT DETECTED

## 2023-07-19 LAB — CBC WITH DIFFERENTIAL/PLATELET
Abs Immature Granulocytes: 0.04 10*3/uL (ref 0.00–0.07)
Basophils Absolute: 0 10*3/uL (ref 0.0–0.1)
Basophils Relative: 0 %
Eosinophils Absolute: 0 10*3/uL (ref 0.0–0.5)
Eosinophils Relative: 0 %
HCT: 39.7 % (ref 36.0–46.0)
Hemoglobin: 12.3 g/dL (ref 12.0–15.0)
Immature Granulocytes: 0 %
Lymphocytes Relative: 6 %
Lymphs Abs: 0.5 10*3/uL — ABNORMAL LOW (ref 0.7–4.0)
MCH: 29.6 pg (ref 26.0–34.0)
MCHC: 31 g/dL (ref 30.0–36.0)
MCV: 95.7 fL (ref 80.0–100.0)
Monocytes Absolute: 0.6 10*3/uL (ref 0.1–1.0)
Monocytes Relative: 6 %
Neutro Abs: 8.3 10*3/uL — ABNORMAL HIGH (ref 1.7–7.7)
Neutrophils Relative %: 88 %
Platelets: 332 10*3/uL (ref 150–400)
RBC: 4.15 MIL/uL (ref 3.87–5.11)
RDW: 16.1 % — ABNORMAL HIGH (ref 11.5–15.5)
WBC: 9.5 10*3/uL (ref 4.0–10.5)
nRBC: 0 % (ref 0.0–0.2)

## 2023-07-19 MED ORDER — ONDANSETRON 4 MG PO TBDP
4.0000 mg | ORAL_TABLET | Freq: Once | ORAL | Status: AC
  Administered 2023-07-19: 4 mg via ORAL
  Filled 2023-07-19: qty 1

## 2023-07-19 MED ORDER — FOSFOMYCIN TROMETHAMINE 3 G PO PACK
3.0000 g | PACK | Freq: Once | ORAL | Status: AC
  Administered 2023-07-19: 3 g via ORAL
  Filled 2023-07-19: qty 3

## 2023-07-19 NOTE — ED Notes (Signed)
Pt roommate contacted via phone per pt request and informed of discharged status. Pt roommate states she will be picking the pt up shortly and will inform staff of her arrival so staff can assist pt to POV.

## 2023-07-19 NOTE — ED Notes (Signed)
Pt refused vitals/ pt upset she has to wait on lobby/ this nurse explained that she is ambulatory and can wait in lobby for roommate

## 2023-07-19 NOTE — ED Provider Notes (Signed)
[place] EMERGENCY DEPARTMENT AT [facility] Provider Note  CSN: [id number] Arrival date & time: 07/18/23 1747  Chief Complaint(s) Nausea  HPI Tracey Nixon is a 68 y.o. female with PMH polysubstance abuse, anxiety who presents emergency room for evaluation of nausea, fever, dysuria, cough and generalized bodyaches.  Patient appears uncomfortable and agitated here in the emergency department and states she generally "just feels uncomfortable".  Denies chest pain, shortness of breath, vomiting or other systemic symptoms.   Past Medical History Past Medical History:  Diagnosis Date   Allergy    SEASONAL   Anxiety    GERD (gastroesophageal reflux disease)    DON'T HAVE ANYMORE SINCE LOST WEIGHT",UPDATED 10/29/22   Hypertension    Patient Active Problem List   Diagnosis Date Noted   Housing insecurity 10/06/2022   Anxiety 10/06/2022   Pure hypercholesterolemia 10/06/2022   HTN (hypertension) 01/18/2016   History of cocaine abuse (HCC) 01/18/2016   Tobacco abuse 01/18/2016   CKD (chronic kidney disease) stage 3, GFR 30-59 ml/min (HCC) 01/18/2016   Home Medication(s) Prior to Admission medications   Medication Sig Start Date End Date Taking? Authorizing Provider  albuterol (VENTOLIN HFA) 108 (90 Base) MCG/ACT inhaler INHALE 1 TO 2 PUFFS INTO THE LUNGS EVERY 6 HOURS AS NEEDED FOR WHEEZING OR SHORTNESS OF BREATH 05/26/23   Levin Erp, MD  amLODipine (NORVASC) 10 MG tablet TAKE 1 TABLET(10 MG) BY MOUTH AT BEDTIME 03/16/23   Levin Erp, MD  fexofenadine (ALLEGRA) 60 MG tablet Take 1 tablet (60 mg total) by mouth 2 (two) times daily. 10/22/22   Levin Erp, MD  valsartan (DIOVAN) 80 MG tablet TAKE 1 TABLET(80 MG) BY MOUTH AT BEDTIME. FOLLOW UP BEFORE ADDITIONAL FILLS 06/12/23   Levin Erp, MD                                                                                                                                    Past Surgical History Past Surgical  History:  Procedure Laterality Date   ABDOMINAL HYSTERECTOMY     TONSILLECTOMY     AS CHILD   Family History Family History  Problem Relation Age of Onset   Kidney disease Mother    Bladder Cancer Sister    Colon cancer Neg Hx    Colon polyps Neg Hx    Crohn's disease Neg Hx    Esophageal cancer Neg Hx    Rectal cancer Neg Hx    Stomach cancer Neg Hx    Ulcerative colitis Neg Hx     Social History Social History   Tobacco Use   Smoking status: Every Day    Current packs/day: 0.50    Average packs/day: 0.5 packs/day for 41.0 years (20.5 ttl pk-yrs)    Types: Cigarettes   Smokeless tobacco: Never  Vaping Use   Vaping status: Never Used  Substance Use Topics   Alcohol use: No   Drug use: No  Comment: hx of cocaine use   Allergies Darvocet [propoxyphene n-acetaminophen] and Demerol [meperidine]  Review of Systems Review of Systems  Constitutional:  Positive for fever.  Respiratory:  Positive for cough.   Genitourinary:  Positive for dysuria.    Physical Exam Vital Signs  I have reviewed the triage vital signs BP (!) 167/97   Pulse 96   Temp (!) 100.6 F (38.1 C) (Oral)   Resp 18   Ht 5\' 3"  (1.6 m)   Wt 60 kg   SpO2 96%   BMI 23.43 kg/m   Physical Exam Vitals and nursing note reviewed.  Constitutional:      General: She is not in acute distress.    Appearance: She is well-developed.  HENT:     Head: Normocephalic and atraumatic.  Eyes:     Conjunctiva/sclera: Conjunctivae normal.  Cardiovascular:     Rate and Rhythm: Normal rate and regular rhythm.     Heart sounds: No murmur heard. Pulmonary:     Effort: Pulmonary effort is normal. No respiratory distress.     Breath sounds: Normal breath sounds.  Abdominal:     Palpations: Abdomen is soft.     Tenderness: There is no abdominal tenderness.  Musculoskeletal:        General: No swelling.     Cervical back: Neck supple.  Skin:    General: Skin is warm and dry.     Capillary Refill:  Capillary refill takes less than 2 seconds.  Neurological:     Mental Status: She is alert.  Psychiatric:        Mood and Affect: Mood normal.     ED Results and Treatments Labs (all labs ordered are listed, but only abnormal results are displayed) Labs Reviewed  RESP PANEL BY RT-PCR (RSV, FLU A&B, COVID)  RVPGX2  COMPREHENSIVE METABOLIC PANEL  CBC WITH DIFFERENTIAL/PLATELET  URINALYSIS, ROUTINE W REFLEX MICROSCOPIC  RAPID URINE DRUG SCREEN, HOSP PERFORMED                                                                                                                          Radiology DG Chest 2 View Result Date: 07/18/2023 CLINICAL DATA:  Cough. EXAM: CHEST - 2 VIEW COMPARISON:  05/03/2013 FINDINGS: The cardiomediastinal contours are normal. Aortic atherosclerosis. Mild bronchial thickening. Pulmonary vasculature is normal. No consolidation, pleural effusion, or pneumothorax. Thoracic spondylosis. No acute osseous abnormalities are seen. IMPRESSION: Bronchial thickening without focal airspace disease. Electronically Signed   By: Narda Rutherford M.D.   On: 07/18/2023 23:48    Pertinent labs & imaging results that were available during my care of the patient were reviewed by me and considered in my medical decision making (see MDM for details).  Medications Ordered in ED Medications  acetaminophen (TYLENOL) tablet 1,000 mg (has no administration in time range)  Procedures Procedures  (including critical care time)  Medical Decision Making / ED Course   This patient presents to the ED for concern of fever, chills, dysuria, cough, this involves an extensive number of treatment options, and is a complaint that carries with it a high risk of complications and morbidity.  The differential diagnosis includes cystitis, unspecified viral URI, influenza,  RSV, COVID-19, pneumonia  MDM: Patient seen emergency room for evaluation of multiple complaints described above.  Physical exam with some mild suprapubic tenderness to palpation but is otherwise unremarkable.  Pulmonary exam negative for wheezing or rales.  Laboratory evaluation largely unremarkable.  Urinalysis is concerning for infection and patient covered with fosfomycin.  UDS is positive for cocaine which explains patient's presenting agitation and hypertension.  COVID, flu, RSV negative.  Chest x-ray unremarkable.  At this time, patient presentation consistent with cystitis and cocaine use.  She was counseled against using any additional cocaine.  She is appropriate covered with antibiotics here in the ER and at this time does not meet inpatient criteria for admission.  Patient discharged with outpatient follow-up   Additional history obtained:  -External records from outside source obtained and reviewed including: Chart review including previous notes, labs, imaging, consultation notes   Lab Tests: -I ordered, reviewed, and interpreted labs.   The pertinent results include:   Labs Reviewed  RESP PANEL BY RT-PCR (RSV, FLU A&B, COVID)  RVPGX2  COMPREHENSIVE METABOLIC PANEL  CBC WITH DIFFERENTIAL/PLATELET  URINALYSIS, ROUTINE W REFLEX MICROSCOPIC  RAPID URINE DRUG SCREEN, HOSP PERFORMED    Imaging Studies ordered: I ordered imaging studies including chest x-ray I independently visualized and interpreted imaging. I agree with the radiologist interpretation   Medicines ordered and prescription drug management: Meds ordered this encounter  Medications   acetaminophen (TYLENOL) tablet 1,000 mg    -I have reviewed the patients home medicines and have made adjustments as needed  Critical interventions none   Social Determinants of Health:  Factors impacting patients care include: Cocaine use   Reevaluation: After the interventions noted above, I reevaluated the patient and  found that they have :improved  Co morbidities that complicate the patient evaluation  Past Medical History:  Diagnosis Date   Allergy    SEASONAL   Anxiety    GERD (gastroesophageal reflux disease)    DON'T HAVE ANYMORE SINCE LOST WEIGHT",UPDATED 10/29/22   Hypertension       Dispostion: I considered admission for this patient, but at this time she does not meet inpatient criteria for admission and will be discharged with outpatient follow-up     Final Clinical Impression(s) / ED Diagnoses Final diagnoses:  None     @PCDICTATION @    Glendora Score, MD 07/19/23 [phone]

## 2023-07-19 NOTE — ED Notes (Signed)
Roommate (theresa) call ed and was upset that pt is not answering her phone/ pts phone was on silent and roommate went back home.

## 2023-07-24 ENCOUNTER — Other Ambulatory Visit: Payer: Self-pay | Admitting: Family Medicine

## 2023-07-24 DIAGNOSIS — Z1231 Encounter for screening mammogram for malignant neoplasm of breast: Secondary | ICD-10-CM

## 2023-07-25 ENCOUNTER — Emergency Department (HOSPITAL_COMMUNITY)
Admission: EM | Admit: 2023-07-25 | Discharge: 2023-07-26 | Disposition: A | Discharge status: Home / Self Care | Payer: 59 | Attending: Emergency Medicine | Admitting: Emergency Medicine

## 2023-07-25 ENCOUNTER — Encounter (HOSPITAL_COMMUNITY): Payer: Self-pay

## 2023-07-25 ENCOUNTER — Emergency Department (HOSPITAL_COMMUNITY): Payer: 59

## 2023-07-25 ENCOUNTER — Other Ambulatory Visit: Payer: Self-pay

## 2023-07-25 DIAGNOSIS — F1491 Cocaine use, unspecified, in remission: Secondary | ICD-10-CM | POA: Diagnosis not present

## 2023-07-25 DIAGNOSIS — F17290 Nicotine dependence, other tobacco product, uncomplicated: Secondary | ICD-10-CM | POA: Insufficient documentation

## 2023-07-25 DIAGNOSIS — R42 Dizziness and giddiness: Secondary | ICD-10-CM | POA: Diagnosis present

## 2023-07-25 DIAGNOSIS — I10 Essential (primary) hypertension: Secondary | ICD-10-CM

## 2023-07-25 DIAGNOSIS — Z79899 Other long term (current) drug therapy: Secondary | ICD-10-CM | POA: Diagnosis not present

## 2023-07-25 DIAGNOSIS — I129 Hypertensive chronic kidney disease with stage 1 through stage 4 chronic kidney disease, or unspecified chronic kidney disease: Secondary | ICD-10-CM | POA: Insufficient documentation

## 2023-07-25 DIAGNOSIS — N183 Chronic kidney disease, stage 3 unspecified: Secondary | ICD-10-CM | POA: Diagnosis not present

## 2023-07-25 NOTE — ED Provider Triage Note (Signed)
Emergency Medicine Provider Triage Evaluation Note  Tracey Nixon , a 68 y.o. female  was evaluated in triage.  Pt complains of lightheadedness, hypertension, urinary tract infection.  Blood pressure medication was recently changed from being in nighttime administration to morning administration.  Patient is currently on antibiotics for UTI.  She began taking those this morning.  She does not endorse normal food intake today due to feeling nauseated secondary to antibiotics.  Review of Systems  Positive:  Negative:   Physical Exam  BP (!) 155/73 (BP Location: Left Arm)   Pulse 71   Temp 98.5 F (36.9 C) (Oral)   Resp 18   SpO2 96%  Gen:   Awake, no distress   Resp:  Normal effort  MSK:   Moves extremities without difficulty  Other:    Medical Decision Making  Medically screening exam initiated at 11:36 PM.  Appropriate orders placed.  Tracey Nixon was informed that the remainder of the evaluation will be completed by another provider, this initial triage assessment does not replace that evaluation, and the importance of remaining in the ED until their evaluation is complete.     Tracey Nixon 07/25/23 2338

## 2023-07-25 NOTE — ED Triage Notes (Signed)
Pt arrived from home via [facility] c/o HTN. Pt states that she is currently being treated for UTI. HTN medications were changed around and restarted at different times today as well as pt began ABX for UTI today. Pt denies chest pain at present. Per EMS bp 180/90

## 2023-07-26 ENCOUNTER — Encounter (HOSPITAL_COMMUNITY): Payer: Self-pay

## 2023-07-26 ENCOUNTER — Emergency Department (HOSPITAL_COMMUNITY): Payer: 59

## 2023-07-26 ENCOUNTER — Emergency Department (HOSPITAL_COMMUNITY)
Admission: EM | Admit: 2023-07-26 | Discharge: 2023-07-26 | Disposition: A | Discharge status: Home / Self Care | Payer: 59 | Attending: Emergency Medicine | Admitting: Emergency Medicine

## 2023-07-26 DIAGNOSIS — Z79899 Other long term (current) drug therapy: Secondary | ICD-10-CM | POA: Insufficient documentation

## 2023-07-26 DIAGNOSIS — I10 Essential (primary) hypertension: Secondary | ICD-10-CM | POA: Insufficient documentation

## 2023-07-26 DIAGNOSIS — I129 Hypertensive chronic kidney disease with stage 1 through stage 4 chronic kidney disease, or unspecified chronic kidney disease: Secondary | ICD-10-CM | POA: Diagnosis not present

## 2023-07-26 DIAGNOSIS — F1491 Cocaine use, unspecified, in remission: Secondary | ICD-10-CM | POA: Insufficient documentation

## 2023-07-26 LAB — CBC WITH DIFFERENTIAL/PLATELET
Abs Immature Granulocytes: 0.05 10*3/uL (ref 0.00–0.07)
Basophils Absolute: 0.1 10*3/uL (ref 0.0–0.1)
Basophils Relative: 1 %
Eosinophils Absolute: 0.2 10*3/uL (ref 0.0–0.5)
Eosinophils Relative: 2 %
HCT: 36 % (ref 36.0–46.0)
Hemoglobin: 11.4 g/dL — ABNORMAL LOW (ref 12.0–15.0)
Immature Granulocytes: 1 %
Lymphocytes Relative: 21 %
Lymphs Abs: 2.2 10*3/uL (ref 0.7–4.0)
MCH: 30.2 pg (ref 26.0–34.0)
MCHC: 31.7 g/dL (ref 30.0–36.0)
MCV: 95.5 fL (ref 80.0–100.0)
Monocytes Absolute: 0.6 10*3/uL (ref 0.1–1.0)
Monocytes Relative: 6 %
Neutro Abs: 7.3 10*3/uL (ref 1.7–7.7)
Neutrophils Relative %: 69 %
Platelets: 415 10*3/uL — ABNORMAL HIGH (ref 150–400)
RBC: 3.77 MIL/uL — ABNORMAL LOW (ref 3.87–5.11)
RDW: 15.9 % — ABNORMAL HIGH (ref 11.5–15.5)
WBC: 10.4 10*3/uL (ref 4.0–10.5)
nRBC: 0 % (ref 0.0–0.2)

## 2023-07-26 LAB — BASIC METABOLIC PANEL
Anion gap: 7 (ref 5–15)
BUN: 25 mg/dL — ABNORMAL HIGH (ref 8–23)
CO2: 22 mmol/L (ref 22–32)
Calcium: 8.4 mg/dL — ABNORMAL LOW (ref 8.9–10.3)
Chloride: 110 mmol/L (ref 98–111)
Creatinine, Ser: 1.08 mg/dL — ABNORMAL HIGH (ref 0.44–1.00)
GFR, Estimated: 56 mL/min — ABNORMAL LOW (ref >60)
Glucose, Bld: 110 mg/dL — ABNORMAL HIGH (ref 70–99)
Potassium: 4.3 mmol/L (ref 3.5–5.1)
Sodium: 139 mmol/L (ref 135–145)

## 2023-07-26 LAB — URINALYSIS, ROUTINE W REFLEX MICROSCOPIC
Bilirubin Urine: NEGATIVE
Glucose, UA: NEGATIVE mg/dL
Hgb urine dipstick: NEGATIVE
Ketones, ur: NEGATIVE mg/dL
Leukocytes,Ua: NEGATIVE
Nitrite: NEGATIVE
Protein, ur: NEGATIVE mg/dL
Specific Gravity, Urine: 1.011 (ref 1.005–1.030)
pH: 6 (ref 5.0–8.0)

## 2023-07-26 LAB — TROPONIN I (HIGH SENSITIVITY): Troponin I (High Sensitivity): 10 ng/L (ref <18)

## 2023-07-26 NOTE — ED Provider Notes (Signed)
[place] EMERGENCY DEPARTMENT AT [facility] Provider Note   CSN: [id number] Arrival date & time: 07/25/23  2327     History  Chief Complaint  Patient presents with   Hypertension    Tracey Nixon is a 68 y.o. female.  Patient presents to the emergency department via EMS complaining of hypertension.  Patient states that her blood pressure medication administration time had recently been changed from nighttime to morning time.  She endorses blood pressures at home during the day of as high as 200 systolic during the day.  She denies chest pain, abdominal pain, nausea, vomiting, difficulty urinating, severe headache.  She does endorse mild lightheadedness earlier in the day when standing.  Patient states she is currently being treated for a urinary tract infection with her first dose of antibiotics being taken earlier today.  She does endorse slightly decreased oral intake due to "queasiness" thought to be due to to the antibiotic.  Past medical history seen for hypertension, CKD stage III, anxiety   Hypertension       Home Medications Prior to Admission medications   Medication Sig Start Date End Date Taking? Authorizing Provider  albuterol (VENTOLIN HFA) 108 (90 Base) MCG/ACT inhaler INHALE 1 TO 2 PUFFS INTO THE LUNGS EVERY 6 HOURS AS NEEDED FOR WHEEZING OR SHORTNESS OF BREATH 05/26/23   Levin Erp, MD  amLODipine (NORVASC) 10 MG tablet TAKE 1 TABLET(10 MG) BY MOUTH AT BEDTIME 03/16/23   Levin Erp, MD  fexofenadine (ALLEGRA) 60 MG tablet Take 1 tablet (60 mg total) by mouth 2 (two) times daily. 10/22/22   Levin Erp, MD  valsartan (DIOVAN) 80 MG tablet TAKE 1 TABLET(80 MG) BY MOUTH AT BEDTIME. FOLLOW UP BEFORE ADDITIONAL FILLS 06/12/23   Levin Erp, MD      Allergies    Darvocet [propoxyphene n-acetaminophen] and Demerol [meperidine]    Review of Systems   Review of Systems  Physical Exam Updated Vital Signs BP (!) 155/73 (BP Location: Left Arm)    Pulse 71   Temp 98.5 F (36.9 C) (Oral)   Resp 18   Ht 5\' 3"  (1.6 m)   Wt 60 kg   SpO2 96%   BMI 23.43 kg/m  Physical Exam Vitals and nursing note reviewed.  Constitutional:      General: She is not in acute distress.    Appearance: She is well-developed.  HENT:     Head: Normocephalic and atraumatic.  Eyes:     Conjunctiva/sclera: Conjunctivae normal.  Cardiovascular:     Rate and Rhythm: Normal rate and regular rhythm.  Pulmonary:     Effort: Pulmonary effort is normal. No respiratory distress.     Breath sounds: Normal breath sounds.  Abdominal:     Palpations: Abdomen is soft.     Tenderness: There is no abdominal tenderness.  Musculoskeletal:        General: No swelling.     Cervical back: Neck supple.  Skin:    General: Skin is warm and dry.     Capillary Refill: Capillary refill takes less than 2 seconds.  Neurological:     General: No focal deficit present.     Mental Status: She is alert.  Psychiatric:        Mood and Affect: Mood normal.     ED Results / Procedures / Treatments   Labs (all labs ordered are listed, but only abnormal results are displayed) Labs Reviewed  BASIC METABOLIC PANEL - Abnormal; Notable for the following components:  Result Value   Glucose, Bld 110 (*)    BUN 25 (*)    Creatinine, Ser 1.08 (*)    Calcium 8.4 (*)    GFR, Estimated 56 (*)    All other components within normal limits  CBC WITH DIFFERENTIAL/PLATELET - Abnormal; Notable for the following components:   RBC 3.77 (*)    Hemoglobin 11.4 (*)    RDW 15.9 (*)    Platelets 415 (*)    All other components within normal limits  URINALYSIS, ROUTINE W REFLEX MICROSCOPIC  TROPONIN I (HIGH SENSITIVITY)    EKG EKG Interpretation Date/Time:  Saturday July 25 2023 23:47:04 EST Ventricular Rate:  64 PR Interval:  132 QRS Duration:  84 QT Interval:  412 QTC Calculation: 425 R Axis:   77  Text Interpretation: Normal sinus rhythm Cannot rule out Anterior  infarct , age undetermined Abnormal ECG Confirmed by Zadie Rhine ([id number]) on 07/25/2023 11:52:10 PM  Radiology CT Head Wo Contrast Result Date: 07/26/2023 CLINICAL DATA:  Lightheadedness and hypertension. EXAM: CT HEAD WITHOUT CONTRAST TECHNIQUE: Contiguous axial images were obtained from the base of the skull through the vertex without intravenous contrast. RADIATION DOSE REDUCTION: This exam was performed according to the departmental dose-optimization program which includes automated exposure control, adjustment of the mA and/or kV according to patient size and/or use of iterative reconstruction technique. COMPARISON:  MRI brain 01/18/2016.  CT head 01/18/2016 FINDINGS: Brain: No evidence of acute infarction, hemorrhage, hydrocephalus, extra-axial collection or mass lesion/mass effect. Low-attenuation changes in the deep white matter likely representing small vessel ischemia although other demyelinating or dysmyelinating processes could also have this appearance. No focal lesions. Vascular: No hyperdense vessel or unexpected calcification. Skull: Normal. Negative for fracture or focal lesion. Sinuses/Orbits: No acute finding. Other: None. IMPRESSION: No acute intracranial abnormalities. Diffuse white matter disease, likely small vessel ischemia. Electronically Signed   By: Burman Nieves M.D.   On: 07/26/2023 00:17    Procedures Procedures    Medications Ordered in ED Medications - No data to display  ED Course/ Medical Decision Making/ A&P                                 Medical Decision Making Amount and/or Complexity of Data Reviewed Labs: ordered. Radiology: ordered.   This patient presents to the ED for concern of hypertension, this involves an extensive number of treatment options, and is a complaint that carries with it a high risk of complications and morbidity.  The differential diagnosis includes hypertensive urgency, hypertensive emergency, intracranial abnormality, endorgan  damage, hypertension, others   Co morbidities that complicate the patient evaluation  Hypertension, anxiety   Additional history obtained:  Additional history obtained from EMS  Lab Tests:  I Ordered, and personally interpreted labs.  The pertinent results include: Grossly unremarkable BMP, CBC, troponin, UA   Imaging Studies ordered:  I ordered imaging studies including head CT I independently visualized and interpreted imaging which showed no acute findings I agree with the radiologist interpretation   Cardiac Monitoring: / EKG:  The patient was maintained on a cardiac monitor.  I personally viewed and interpreted the cardiac monitored which showed an underlying rhythm of: Sinus rhythm   Social Determinants of Health:  Patient smokes tobacco daily   Test / Admission - Considered:  Patient with no signs of endorgan damage.  Blood pressure is now improved with blood pressure upon arrival of 155/73.  Kidney function  at baseline.  No elevation in troponin.  EKG unremarkable.  Head CT with no acute findings.  Patient ambulatory without difficulty.  No indication for further emergent workup or admission.  Patient discharged home with recommendation for follow-up with primary care for further evaluation of blood pressure as needed.         Final Clinical Impression(s) / ED Diagnoses Final diagnoses:  Hypertension, unspecified type    Rx / DC Orders ED Discharge Orders     None         Pamala Duffel 07/26/23 0340    Zadie Rhine, MD 07/26/23 [phone]

## 2023-07-26 NOTE — ED Triage Notes (Signed)
Pt BIB [facility] from home d/t HTN and weakness for the past couple of days. Pt was seen yesterday for same. Pt's Bp w EMS is in the 190s. A&O X4. Baseline ambulatory.

## 2023-07-26 NOTE — ED Provider Notes (Addendum)
[place] EMERGENCY DEPARTMENT AT [facility] Provider Note   CSN: [id number] Arrival date & time: 07/26/23  1144     History  Chief Complaint  Patient presents with   Hypertension    Tracey Nixon is a 68 y.o. female.  Patient with hx htn, presents with concern that blood pressure is high. Hx same. Indicates did take her bp med today. Recent ed eval for same. Denies headache. No change in vision or speech. Denies numbness/weakness or any loss of normal functional ability. No chest pain or discomfort. No sob or unusual doe or fatigue. No abd pain or nvd. No gu c/o. No extremity pain or swelling. Pt indicates has not yet eaten today, and is asking for something to eat and drink. EMS indicates initial sbp in 190 range.   The history is provided by the patient, medical records and the EMS personnel.  Hypertension Pertinent negatives include no chest pain, no abdominal pain, no headaches and no shortness of breath.  Weakness Associated symptoms: no abdominal pain, no chest pain, no cough, no diarrhea, no dysuria, no fever, no headaches, no shortness of breath and no vomiting        Home Medications Prior to Admission medications   Medication Sig Start Date End Date Taking? Authorizing Provider  albuterol (VENTOLIN HFA) 108 (90 Base) MCG/ACT inhaler INHALE 1 TO 2 PUFFS INTO THE LUNGS EVERY 6 HOURS AS NEEDED FOR WHEEZING OR SHORTNESS OF BREATH 05/26/23   Levin Erp, MD  amLODipine (NORVASC) 10 MG tablet TAKE 1 TABLET(10 MG) BY MOUTH AT BEDTIME 03/16/23   Levin Erp, MD  fexofenadine (ALLEGRA) 60 MG tablet Take 1 tablet (60 mg total) by mouth 2 (two) times daily. 10/22/22   Levin Erp, MD  valsartan (DIOVAN) 80 MG tablet TAKE 1 TABLET(80 MG) BY MOUTH AT BEDTIME. FOLLOW UP BEFORE ADDITIONAL FILLS 06/12/23   Levin Erp, MD      Allergies    Darvocet [propoxyphene n-acetaminophen] and Demerol [meperidine]    Review of Systems   Review of Systems   Constitutional:  Negative for chills and fever.  HENT:  Negative for sore throat.   Eyes:  Negative for pain and visual disturbance.  Respiratory:  Negative for cough and shortness of breath.   Cardiovascular:  Negative for chest pain, palpitations and leg swelling.  Gastrointestinal:  Negative for abdominal pain, diarrhea and vomiting.  Genitourinary:  Negative for dysuria and flank pain.  Musculoskeletal:  Negative for back pain and neck pain.  Skin:  Negative for rash.  Neurological:  Negative for headaches.  Psychiatric/Behavioral:  Negative for confusion.     Physical Exam Updated Vital Signs BP (!) 165/81   Pulse 66   Temp 98 F (36.7 C) (Oral)   Resp (!) 26   SpO2 98%  Physical Exam Vitals and nursing note reviewed.  Constitutional:      Appearance: Normal appearance. She is well-developed.  HENT:     Head: Atraumatic.     Nose: Nose normal.     Mouth/Throat:     Mouth: Mucous membranes are moist.  Eyes:     General: No scleral icterus.    Conjunctiva/sclera: Conjunctivae normal.     Pupils: Pupils are equal, round, and reactive to light.  Neck:     Vascular: No carotid bruit.     Trachea: No tracheal deviation.     Comments: Trachea midline. Thyroid not grossly enlarged or tender.  Cardiovascular:     Rate and Rhythm: Normal rate  and regular rhythm.     Pulses: Normal pulses.     Heart sounds: Normal heart sounds. No murmur heard.    No friction rub. No gallop.  Pulmonary:     Effort: Pulmonary effort is normal. No respiratory distress.     Breath sounds: Normal breath sounds.  Abdominal:     General: Bowel sounds are normal. There is no distension.     Palpations: Abdomen is soft. There is no mass.     Tenderness: There is no abdominal tenderness. There is no guarding.     Comments: No bruits.   Genitourinary:    Comments: No cva tenderness.  Musculoskeletal:        General: No swelling or tenderness.     Cervical back: Normal range of motion and neck  supple. No rigidity. No muscular tenderness.     Right lower leg: No edema.     Left lower leg: No edema.  Skin:    General: Skin is warm and dry.     Findings: No rash.  Neurological:     Mental Status: She is alert.     Comments: Alert, speech normal. Motor/sens grossly intact bil. Steady gait.   Psychiatric:        Mood and Affect: Mood normal.     ED Results / Procedures / Treatments   Labs (all labs ordered are listed, but only abnormal results are displayed) Results for orders placed or performed during the hospital encounter of 07/25/23  Basic metabolic panel   Collection Time: 07/25/23 11:59 PM  Result Value Ref Range   Sodium 139 135 - 145 mmol/L   Potassium 4.3 3.5 - 5.1 mmol/L   Chloride 110 98 - 111 mmol/L   CO2 22 22 - 32 mmol/L   Glucose, Bld 110 (H) 70 - 99 mg/dL   BUN 25 (H) 8 - 23 mg/dL   Creatinine, Ser 0.98 (H) 0.44 - 1.00 mg/dL   Calcium 8.4 (L) 8.9 - 10.3 mg/dL   GFR, Estimated 56 (L) >60 mL/min   Anion gap 7 5 - 15  CBC with Differential   Collection Time: 07/25/23 11:59 PM  Result Value Ref Range   WBC 10.4 4.0 - 10.5 K/uL   RBC 3.77 (L) 3.87 - 5.11 MIL/uL   Hemoglobin 11.4 (L) 12.0 - 15.0 g/dL   HCT 11.9 14.7 - 82.9 %   MCV 95.5 80.0 - 100.0 fL   MCH 30.2 26.0 - 34.0 pg   MCHC 31.7 30.0 - 36.0 g/dL   RDW 56.2 (H) 13.0 - 86.5 %   Platelets 415 (H) 150 - 400 K/uL   nRBC 0.0 0.0 - 0.2 %   Neutrophils Relative % 69 %   Neutro Abs 7.3 1.7 - 7.7 K/uL   Lymphocytes Relative 21 %   Lymphs Abs 2.2 0.7 - 4.0 K/uL   Monocytes Relative 6 %   Monocytes Absolute 0.6 0.1 - 1.0 K/uL   Eosinophils Relative 2 %   Eosinophils Absolute 0.2 0.0 - 0.5 K/uL   Basophils Relative 1 %   Basophils Absolute 0.1 0.0 - 0.1 K/uL   Immature Granulocytes 1 %   Abs Immature Granulocytes 0.05 0.00 - 0.07 K/uL  Urinalysis, Routine w reflex microscopic -Urine, Clean Catch   Collection Time: 07/25/23 11:59 PM  Result Value Ref Range   Color, Urine YELLOW YELLOW    APPearance CLEAR CLEAR   Specific Gravity, Urine 1.011 1.005 - 1.030   pH 6.0 5.0 - 8.0  Glucose, UA NEGATIVE NEGATIVE mg/dL   Hgb urine dipstick NEGATIVE NEGATIVE   Bilirubin Urine NEGATIVE NEGATIVE   Ketones, ur NEGATIVE NEGATIVE mg/dL   Protein, ur NEGATIVE NEGATIVE mg/dL   Nitrite NEGATIVE NEGATIVE   Leukocytes,Ua NEGATIVE NEGATIVE  Troponin I (High Sensitivity)   Collection Time: 07/25/23 11:59 PM  Result Value Ref Range   Troponin I (High Sensitivity) 10 <18 ng/L   CT Head Wo Contrast Result Date: 07/26/2023 CLINICAL DATA:  Lightheadedness and hypertension. EXAM: CT HEAD WITHOUT CONTRAST TECHNIQUE: Contiguous axial images were obtained from the base of the skull through the vertex without intravenous contrast. RADIATION DOSE REDUCTION: This exam was performed according to the departmental dose-optimization program which includes automated exposure control, adjustment of the mA and/or kV according to patient size and/or use of iterative reconstruction technique. COMPARISON:  MRI brain 01/18/2016.  CT head 01/18/2016 FINDINGS: Brain: No evidence of acute infarction, hemorrhage, hydrocephalus, extra-axial collection or mass lesion/mass effect. Low-attenuation changes in the deep white matter likely representing small vessel ischemia although other demyelinating or dysmyelinating processes could also have this appearance. No focal lesions. Vascular: No hyperdense vessel or unexpected calcification. Skull: Normal. Negative for fracture or focal lesion. Sinuses/Orbits: No acute finding. Other: None. IMPRESSION: No acute intracranial abnormalities. Diffuse white matter disease, likely small vessel ischemia. Electronically Signed   By: Burman Nieves M.D.   On: 07/26/2023 00:17   DG Chest 2 View Result Date: 07/18/2023 CLINICAL DATA:  Cough. EXAM: CHEST - 2 VIEW COMPARISON:  05/03/2013 FINDINGS: The cardiomediastinal contours are normal. Aortic atherosclerosis. Mild bronchial thickening. Pulmonary  vasculature is normal. No consolidation, pleural effusion, or pneumothorax. Thoracic spondylosis. No acute osseous abnormalities are seen. IMPRESSION: Bronchial thickening without focal airspace disease. Electronically Signed   By: Narda Rutherford M.D.   On: 07/18/2023 23:48     EKG EKG Interpretation Date/Time:  Sunday July 26 2023 11:55:52 EST Ventricular Rate:  56 PR Interval:  135 QRS Duration:  85 QT Interval:  444 QTC Calculation: 429 R Axis:   70  Text Interpretation: Sinus rhythm No significant change since last tracing Confirmed by Cathren Laine ([id number]) on 07/26/2023 12:01:19 PM  Radiology CT Head Wo Contrast Result Date: 07/26/2023 CLINICAL DATA:  Lightheadedness and hypertension. EXAM: CT HEAD WITHOUT CONTRAST TECHNIQUE: Contiguous axial images were obtained from the base of the skull through the vertex without intravenous contrast. RADIATION DOSE REDUCTION: This exam was performed according to the departmental dose-optimization program which includes automated exposure control, adjustment of the mA and/or kV according to patient size and/or use of iterative reconstruction technique. COMPARISON:  MRI brain 01/18/2016.  CT head 01/18/2016 FINDINGS: Brain: No evidence of acute infarction, hemorrhage, hydrocephalus, extra-axial collection or mass lesion/mass effect. Low-attenuation changes in the deep white matter likely representing small vessel ischemia although other demyelinating or dysmyelinating processes could also have this appearance. No focal lesions. Vascular: No hyperdense vessel or unexpected calcification. Skull: Normal. Negative for fracture or focal lesion. Sinuses/Orbits: No acute finding. Other: None. IMPRESSION: No acute intracranial abnormalities. Diffuse white matter disease, likely small vessel ischemia. Electronically Signed   By: Burman Nieves M.D.   On: 07/26/2023 00:17    Procedures Procedures    Medications Ordered in ED Medications - No data to  display  ED Course/ Medical Decision Making/ A&P                                 Medical Decision Making  Problems Addressed: Essential hypertension: chronic illness or injury with exacerbation, progression, or side effects of treatment that poses a threat to life or bodily functions History of cocaine use: acute illness or injury    Details: Acute/chronic  Amount and/or Complexity of Data Reviewed Independent Historian: EMS    Details: hx External Data Reviewed: labs, radiology and notes. Labs:  Decision-making details documented in ED Course. Radiology: ordered and independent interpretation performed. Decision-making details documented in ED Course. ECG/medicine tests: ordered and independent interpretation performed. Decision-making details documented in ED Course.  Risk Decision regarding hospitalization.   Iv ns. Continuous pulse ox and cardiac monitoring. Recent labs reviewed/interpreted. Imaging ordered.   Differential diagnosis includes uncontrolled htn, aki, etc. Dispo decision including potential need for admission considered - will check lab  results and imaging and reassess.   Reviewed nursing notes and prior charts for additional history. External reports reviewed. Additional history from: EMS.  Recent UDS +cocaine.   Pt indicates did take her bp med earlier. Bp 165/81, above normal range, but does not require emergent lowering.   Cardiac monitor: sinus rhythm, rate 66.  Recent labs reviewed/interpreted by me - wbc and hct normal. Chem unremarkable. Ua neg for uti. Trop normal.   CT reviewed/interpreted by me - no hem.   Pt asking for po fluids/food - provided.  Pt indicates has adequate of her meds at home, and does have pcp with whom she can f/u.   Pt currently appears stable for d/c.   Rec close pcp f/u.  Return precautions provided.          Final Clinical Impression(s) / ED Diagnoses Final diagnoses:  None    Rx / DC Orders ED Discharge  Orders     None           Cathren Laine, MD 07/26/23 1356

## 2023-07-26 NOTE — Discharge Instructions (Addendum)
It was our pleasure to provide your ER care today - we hope that you feel better.  Take your blood pressure medication as prescribed, follow heart heart healthy eating plan. Make sure to avoid any cocaine use as it is harmful to your physical health and mental well-being.   Follow up closely with primary care doctor in the coming week for recheck.  Return to ER if worse, new symptoms, fevers, chest pain, trouble breathing, one-sided numbness/weakness, severe headache, or other concern.

## 2023-07-26 NOTE — Discharge Instructions (Addendum)
Your workup today was reassuring.  Please follow-up as needed with your primary care team for further evaluation and management of your blood pressure.

## 2023-07-27 ENCOUNTER — Telehealth: Payer: Self-pay

## 2023-07-27 ENCOUNTER — Other Ambulatory Visit: Payer: Self-pay | Admitting: Family Medicine

## 2023-07-27 DIAGNOSIS — Z780 Asymptomatic menopausal state: Secondary | ICD-10-CM

## 2023-07-27 NOTE — Telephone Encounter (Signed)
Scheduled bp f/u on 02/10 @1130 

## 2023-08-03 ENCOUNTER — Ambulatory Visit: Payer: 59 | Admitting: Student

## 2023-08-03 NOTE — Progress Notes (Deleted)
    SUBJECTIVE:   CHIEF COMPLAINT / HPI: HTN  Seen 3 times in ED since beginning of year for HTN UDS +cocaine 1/26 Meds include valsartan 80, amlodipine 10  PERTINENT  PMH / PSH: ***  OBJECTIVE:   There were no vitals taken for this visit.  ***  ASSESSMENT/PLAN:   No problem-specific Assessment & Plan notes found for this encounter.     Levin Erp, MD [facility]

## 2024-03-12 ENCOUNTER — Ambulatory Visit
Admission: EM | Admit: 2024-03-12 | Discharge: 2024-03-12 | Disposition: A | Discharge status: Home / Self Care | Attending: Family Medicine | Admitting: Family Medicine

## 2024-03-12 ENCOUNTER — Other Ambulatory Visit: Payer: Self-pay

## 2024-03-12 DIAGNOSIS — B349 Viral infection, unspecified: Secondary | ICD-10-CM

## 2024-03-12 MED ORDER — BENZONATATE 100 MG PO CAPS: 100.0000 mg | ORAL_CAPSULE | Freq: Three times a day (TID) | ORAL | 0 refills | Status: AC

## 2024-03-12 NOTE — ED Provider Notes (Signed)
 [facility]    CSN: [id number] Arrival date & time: 03/12/24  0944      History   Chief Complaint No chief complaint on file.   HPI Tracey Nixon is a 68 y.o. female  presents for evaluation of URI symptoms for 2 days.  Patient has past medical history of hypertension, GERD, anxiety patient reports associated symptoms of cough, congestion, scratchy throat, chills, body aches, diarrhea, nausea. Denies vomiting, fevers, ear pain, sore throat, shortness of breath. Patient does not have a hx of asthma. Patient is an active smoker.   Reports no known sick contacts.  Pt has taken nothing OTC for symptoms. Pt has no other concerns at this time.   HPI  Past Medical History:  Diagnosis Date   Allergy    SEASONAL   Anxiety    GERD (gastroesophageal reflux disease)    DON'T HAVE ANYMORE SINCE LOST WEIGHT,UPDATED 10/29/22   Hypertension     Patient Active Problem List   Diagnosis Date Noted   Housing insecurity 10/06/2022   Anxiety 10/06/2022   Pure hypercholesterolemia 10/06/2022   HTN (hypertension) 01/18/2016   History of cocaine abuse (HCC) 01/18/2016   Tobacco abuse 01/18/2016   CKD (chronic kidney disease) stage 3, GFR 30-59 ml/min (HCC) 01/18/2016    Past Surgical History:  Procedure Laterality Date   ABDOMINAL HYSTERECTOMY     TONSILLECTOMY     AS CHILD    OB History   No obstetric history on file.      Home Medications    Prior to Admission medications   Medication Sig Start Date End Date Taking? Authorizing Provider  benzonatate  (TESSALON ) 100 MG capsule Take 1 capsule (100 mg total) by mouth every 8 (eight) hours. 03/12/24  Yes Loreda Myla SAUNDERS, NP  albuterol  (VENTOLIN  HFA) 108 (90 Base) MCG/ACT inhaler INHALE 1 TO 2 PUFFS INTO THE LUNGS EVERY 6 HOURS AS NEEDED FOR WHEEZING OR SHORTNESS OF BREATH 05/26/23   Christia Budds, MD  amLODipine  (NORVASC ) 10 MG tablet TAKE 1 TABLET(10 MG) BY MOUTH AT BEDTIME 03/16/23   Christia Budds, MD  fexofenadine   (ALLEGRA ) 60 MG tablet Take 1 tablet (60 mg total) by mouth 2 (two) times daily. 10/22/22   Christia Budds, MD  valsartan  (DIOVAN ) 80 MG tablet TAKE 1 TABLET(80 MG) BY MOUTH AT BEDTIME. FOLLOW UP BEFORE ADDITIONAL FILLS 06/12/23   Christia Budds, MD    Family History Family History  Problem Relation Age of Onset   Kidney disease Mother    Bladder Cancer Sister    Colon cancer Neg Hx    Colon polyps Neg Hx    Crohn's disease Neg Hx    Esophageal cancer Neg Hx    Rectal cancer Neg Hx    Stomach cancer Neg Hx    Ulcerative colitis Neg Hx     Social History Social History   Tobacco Use   Smoking status: Every Day    Current packs/day: 0.50    Average packs/day: 0.5 packs/day for 41.0 years (20.5 ttl pk-yrs)    Types: Cigarettes   Smokeless tobacco: Never  Vaping Use   Vaping status: Never Used  Substance Use Topics   Alcohol use: No   Drug use: Yes    Types: Cocaine     Allergies   Darvocet [propoxyphene n-acetaminophen ] and Demerol [meperidine]   Review of Systems Review of Systems  Constitutional:  Positive for chills.  HENT:  Positive for congestion.        Scratchy throat  Respiratory:  Positive for cough.   Gastrointestinal:  Positive for diarrhea and nausea.  Musculoskeletal:  Positive for myalgias.     Physical Exam Triage Vital Signs ED Triage Vitals  Encounter Vitals Group     BP 03/12/24 1012 (!) 163/78     Girls Systolic BP Percentile --      Girls Diastolic BP Percentile --      Boys Systolic BP Percentile --      Boys Diastolic BP Percentile --      Pulse Rate 03/12/24 1012 61     Resp 03/12/24 1012 16     Temp 03/12/24 1012 98.5 F (36.9 C)     Temp Source 03/12/24 1012 Oral     SpO2 03/12/24 1012 95 %     Weight --      Height --      Head Circumference --      Peak Flow --      Pain Score 03/12/24 1011 7     Pain Loc --      Pain Education --      Exclude from Growth Chart --    No data found.  Updated Vital Signs BP (!)  163/78   Pulse 61   Temp 98.5 F (36.9 C) (Oral)   Resp 16   SpO2 95%   Visual Acuity Right Eye Distance:   Left Eye Distance:   Bilateral Distance:    Right Eye Near:   Left Eye Near:    Bilateral Near:     Physical Exam Vitals and nursing note reviewed.  Constitutional:      General: She is not in acute distress.    Appearance: She is well-developed. She is not ill-appearing.  HENT:     Head: Normocephalic and atraumatic.     Right Ear: Tympanic membrane and ear canal normal.     Left Ear: Tympanic membrane and ear canal normal.     Nose: Congestion present.     Mouth/Throat:     Mouth: Mucous membranes are moist.     Pharynx: Oropharynx is clear. Uvula midline. No oropharyngeal exudate or posterior oropharyngeal erythema.     Tonsils: No tonsillar exudate or tonsillar abscesses.  Eyes:     Conjunctiva/sclera: Conjunctivae normal.     Pupils: Pupils are equal, round, and reactive to light.  Cardiovascular:     Rate and Rhythm: Normal rate and regular rhythm.     Heart sounds: Normal heart sounds.  Pulmonary:     Effort: Pulmonary effort is normal.     Breath sounds: Normal breath sounds. No wheezing or rhonchi.  Musculoskeletal:     Cervical back: Normal range of motion and neck supple.  Lymphadenopathy:     Cervical: No cervical adenopathy.  Skin:    General: Skin is warm and dry.  Neurological:     General: No focal deficit present.     Mental Status: She is alert and oriented to person, place, and time.  Psychiatric:        Mood and Affect: Mood normal.        Behavior: Behavior normal.      UC Treatments / Results  Labs (all labs ordered are listed, but only abnormal results are displayed) Labs Reviewed - No data to display  EKG   Radiology No results found.  Procedures Procedures (including critical care time)  Medications Ordered in UC Medications - No data to display  Initial Impression / Assessment and Plan / UC  Course  I have  reviewed the triage vital signs and the nursing notes.  Pertinent labs & imaging results that were available during my care of the patient were reviewed by me and considered in my medical decision making (see chart for details).     I reviewed exam and symptoms with patient.  She declined COVID, flu, strep throat testing.  Discussed viral illness and symptomatic treatment.  Tessalon  as needed for cough.  Encouraged rest fluids and PCP follow-up in 2 to 3 days for recheck.  Strict ER precautions reviewed and patient verbalized understanding. Final Clinical Impressions(s) / UC Diagnoses   Final diagnoses:  Viral illness     Discharge Instructions      Please treat your symptoms with over the counter tylenol  , humidifier, and rest.  You may take Tessalon  as needed for your cough 3 times a day.  Viral illnesses can last 7-14 days. Please follow up with your PCP in 2-3 days for recheck. Please go to the ER for any worsening symptoms. This includes but is not limited to fever you can not control with tylenol , you are not able to stay hydrated, you have shortness of breath or chest pain.  Thank you for choosing [facility] for your healthcare needs. I hope you feel better soon!     ED Prescriptions     Medication Sig Dispense Auth. Provider   benzonatate  (TESSALON ) 100 MG capsule Take 1 capsule (100 mg total) by mouth every 8 (eight) hours. 21 capsule Dalessandro Baldyga, Jodi R, NP      PDMP not reviewed this encounter.   Loreda Myla SAUNDERS, NP 03/12/24 1050

## 2024-03-12 NOTE — Discharge Instructions (Addendum)
 Please treat your symptoms with over the counter tylenol  , humidifier, and rest.  You may take Tessalon  as needed for your cough 3 times a day.  Viral illnesses can last 7-14 days. Please follow up with your PCP in 2-3 days for recheck. Please go to the ER for any worsening symptoms. This includes but is not limited to fever you can not control with tylenol , you are not able to stay hydrated, you have shortness of breath or chest pain.  Thank you for choosing [facility] for your healthcare needs. I hope you feel better soon!

## 2024-03-12 NOTE — ED Triage Notes (Signed)
 Pt c/o bodyaches, chills, sore throat, diarrheax2d
